# Patient Record
Sex: Female | Born: 1995 | Race: White | Hispanic: No | Marital: Single | Smoking: Never smoker
Health system: Southern US, Community
[De-identification: ages and names within clinical notes are randomized; demographics above are authoritative.]

## PROBLEM LIST (undated history)

## (undated) NOTE — Progress Notes (Signed)
 Formatting of this note is different from the original.  [facility] Treatment Note   Loretta Bennett is here for an infusion of  Administrations This Visit     0.9%  NaCl infusion     Admin Date 11/06/2022 Action New Bag Dose  Rate 30 mL/hr Route Intravenous Documented By Abby Greig BRAVO, RN       diphenhydrAMINE  (BENADRYL ) capsule     Admin Date 11/06/2022 Action Given Dose 25 mg Route Oral Documented By Abby Greig BRAVO, RN       iron dextran complex (INFED) 25 mg in sodium chloride  0.9 % 25 mL IVPB     Admin Date 11/06/2022 Action New Bag Dose 25 mg Rate 300 mL/hr Route Intravenous Documented By Abby Greig BRAVO, RN       iron dextran complex (INFED) 975 mg in sodium chloride  0.9 % 250 mL IVPB     Admin Date 11/06/2022 Action New Bag Dose 975 mg Rate 250 mL/hr Route Intravenous Documented By Salvo, Kayla L, RN        PLAN OF CARE  GOAL:  Loretta Bennett will receive ordered infusion without signs of hypersensitivity.    Intervention:   Education provided to patient.  Questions or concerns addressed.  Patient was monitored throughout the infusion per physician orders.  Vital signs monitored per unit standards.  Vitals:   11/06/22 1635 11/06/22 1654 11/06/22 1842  BP: 121/74 102/62 107/66  Pulse: 78 68 60  Resp: 16 20 20   Temp: 36.3 C (97.3 F) 36.4 C (97.5 F) 36.3 C (97.3 F)  TempSrc: Temporal Temporal Temporal  SpO2: 99% 100% 100%   DISCHARGE:   Loretta Bennett tolerated infusion/injection well. Vital signs stable.  Discharge criteria met. Discharge instructions reviewed. No further questions at this time.  Patient discharged in stable condition. Belongings sent with patient.  See MAR, Flowsheets, and Infusion Navigator for further documentation and/or assessments.   Future Visits: No future appointments.    Electronically signed by Abby Greig BRAVO, RN at 11/06/2022  6:45 PM CDT

---
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
## 2017-04-06 IMAGING — US US ABDOMEN LIMITED
1 series · 14 of 25 positions shown · non-contrast
Comparison: None.

CLINICAL DATA: Acute onset of right upper quadrant abdominal pain.
Initial encounter.

EXAM:
US ABDOMEN LIMITED - RIGHT UPPER QUADRANT

[Series 1: us abdomen limited · 0.20mm/px · 14 of 53 slices shown]
[im 1/53]
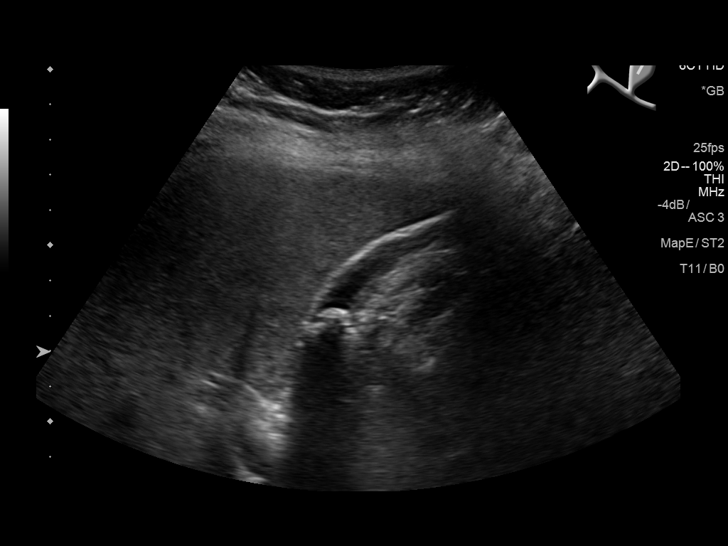
[im 5/53]
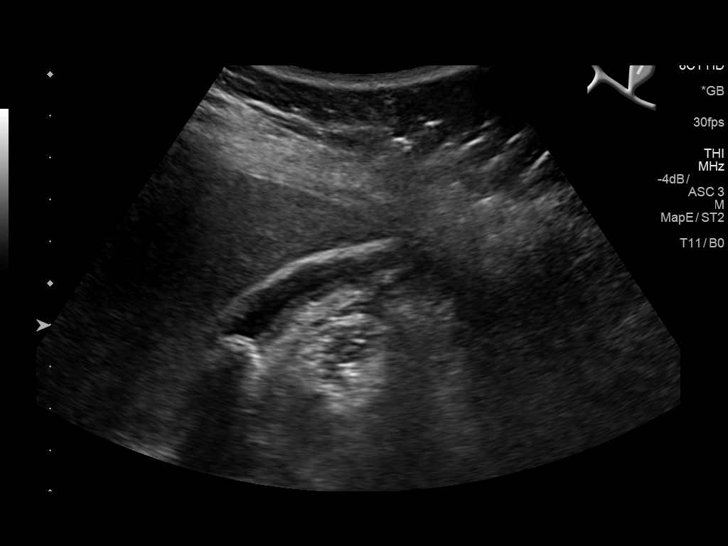
[im 9/53]
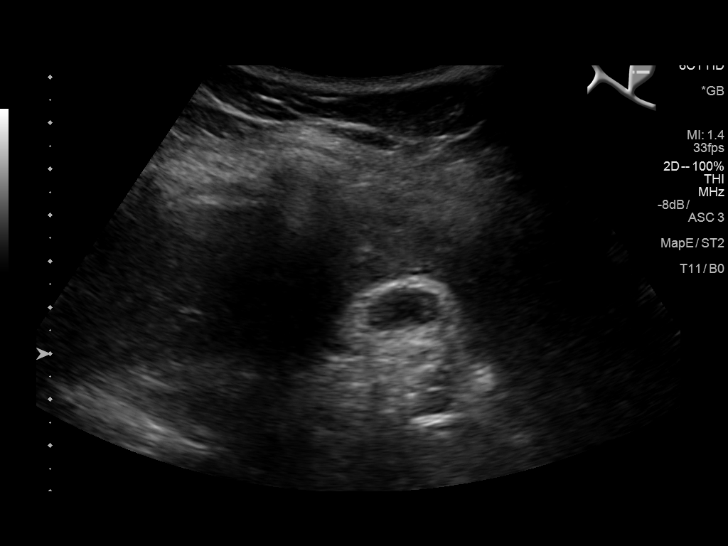
[im 14/53]
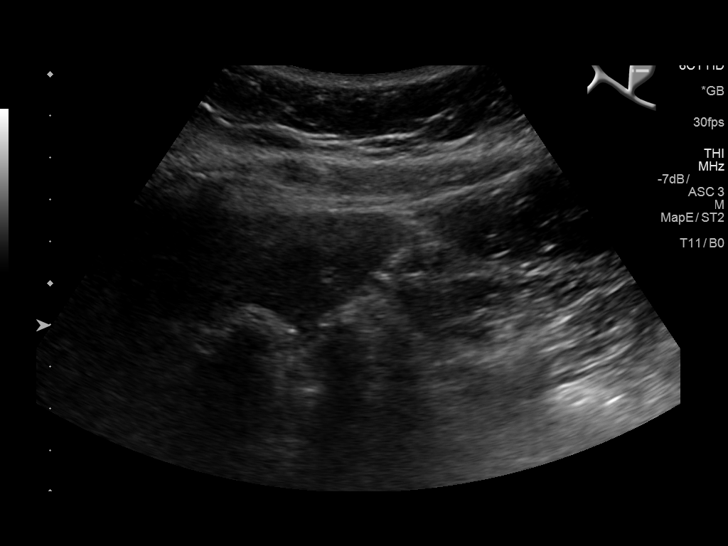
[im 18/53]
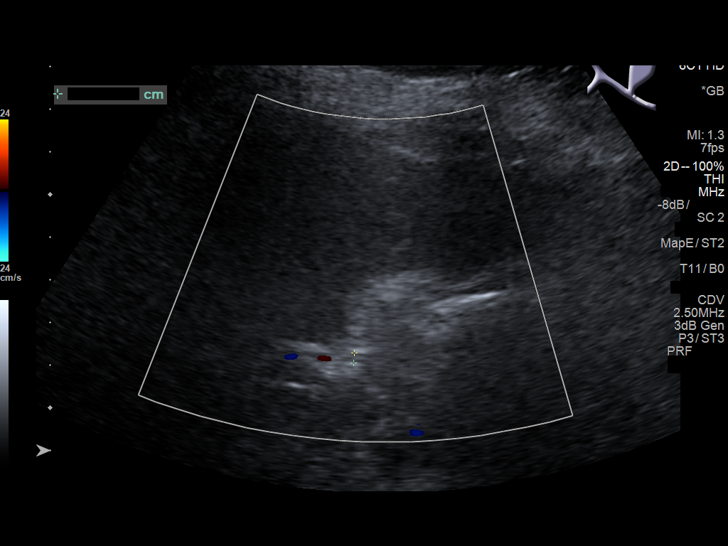
[im 20/53]
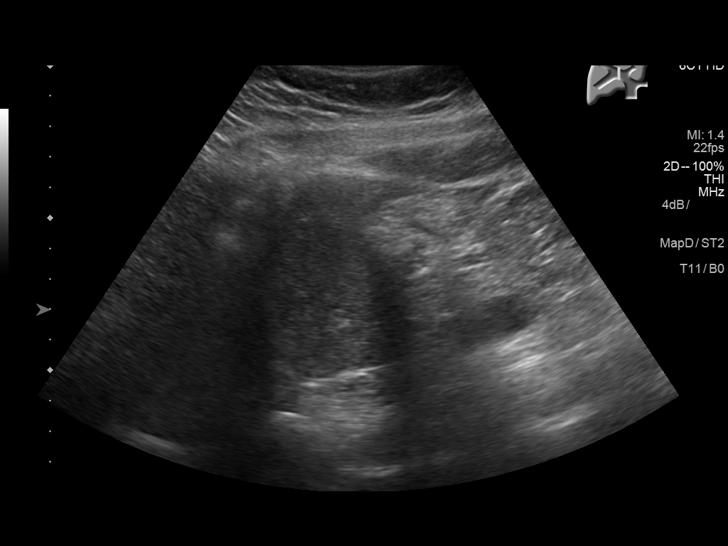
[im 24/53]
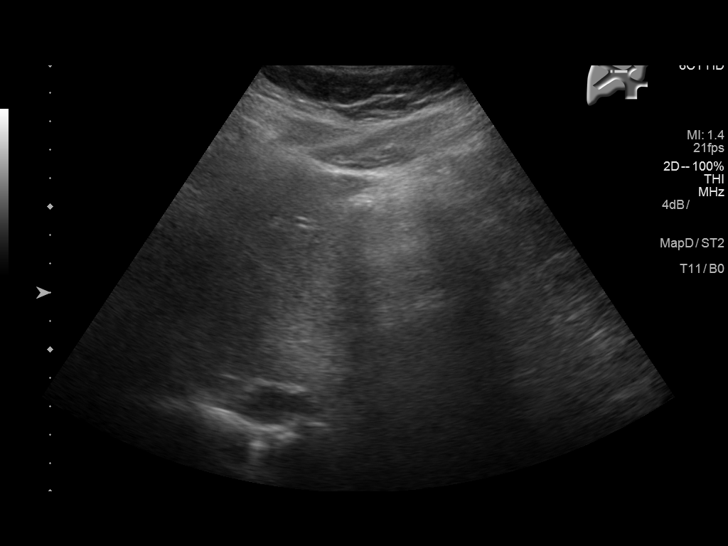
[im 29/53]
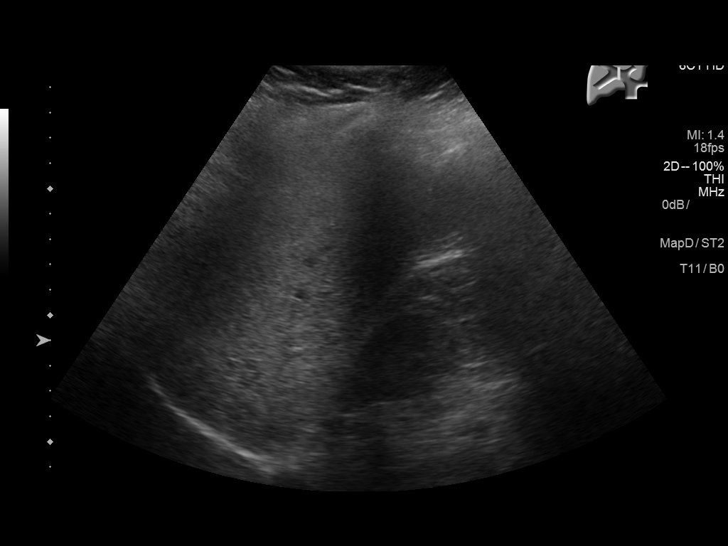
[im 33/53]
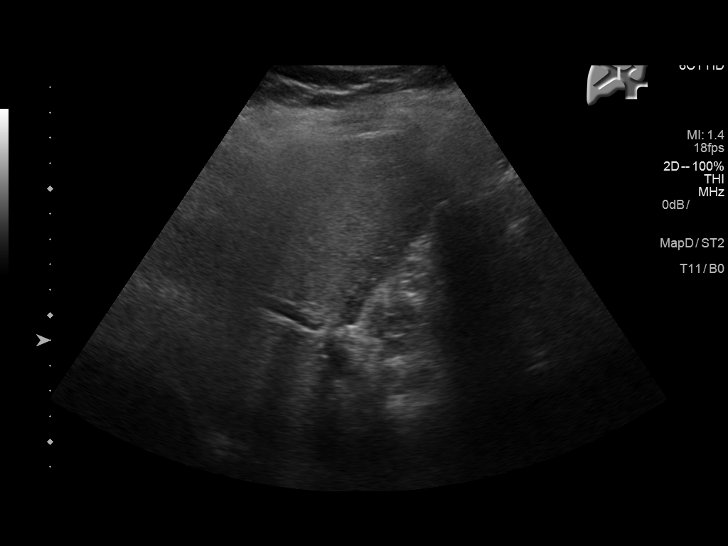
[im 35/53]
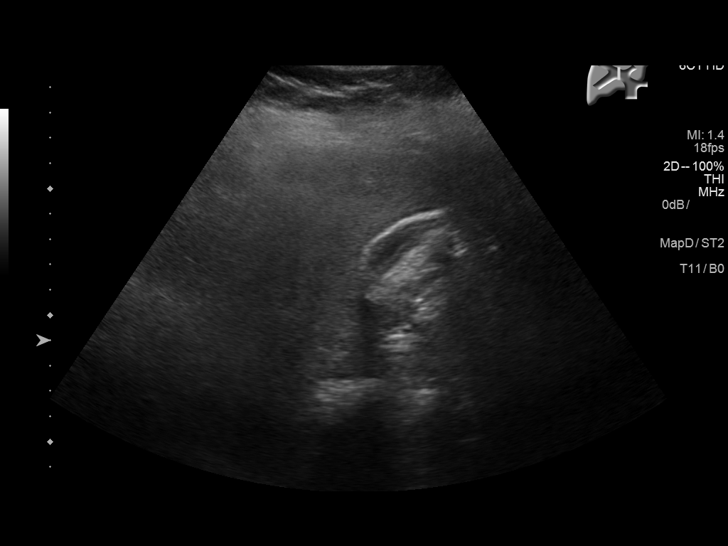
[im 40/53]
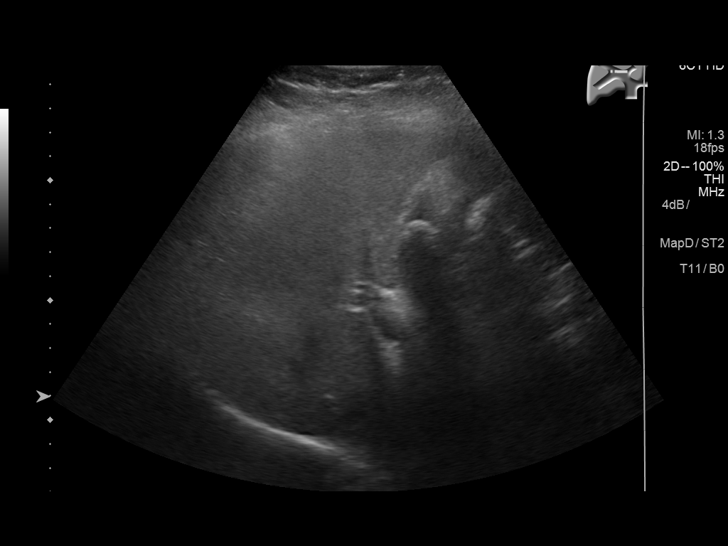
[im 44/53]
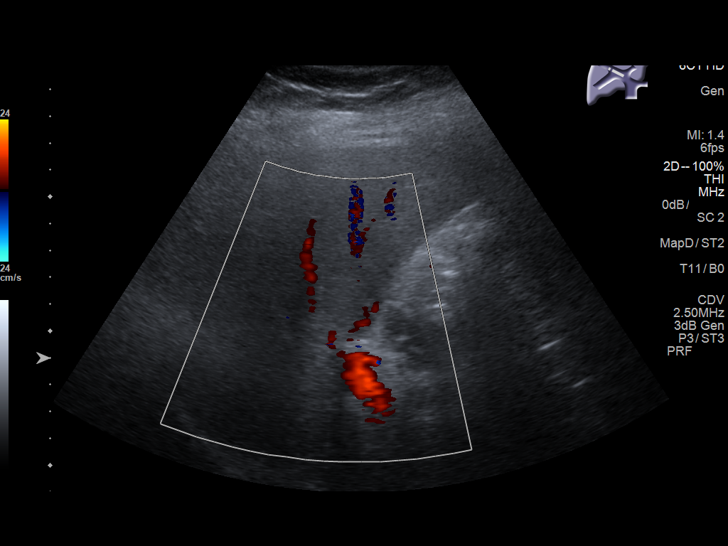
[im 48/53]
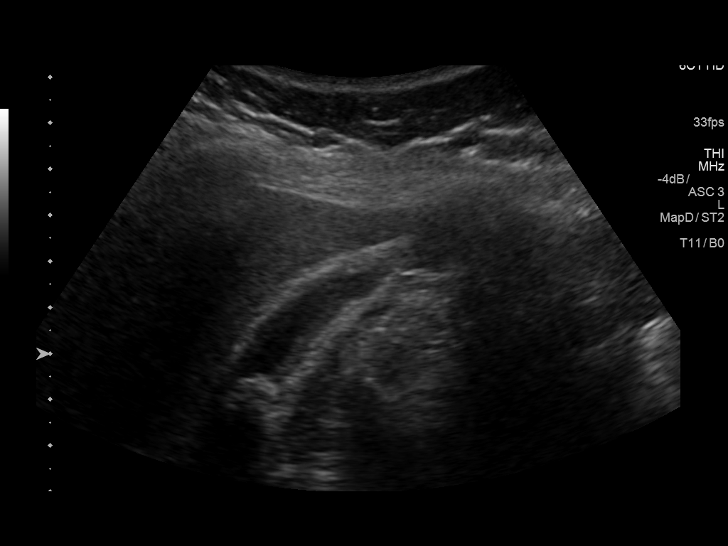
[im 53/53]
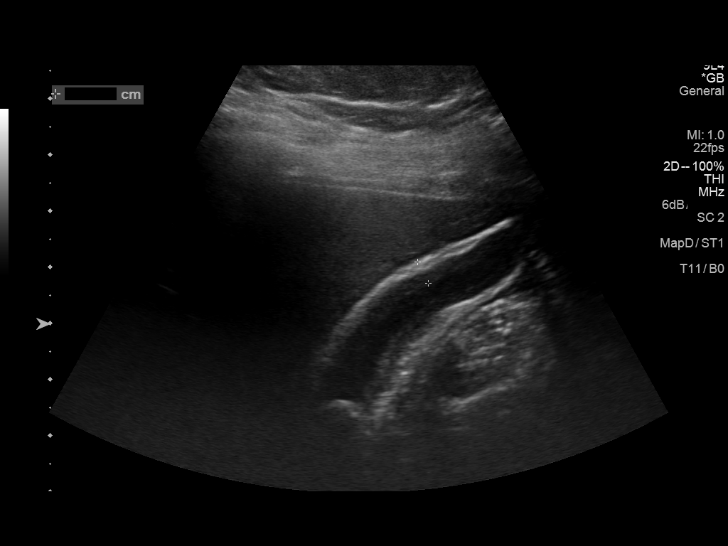

[14 of 25 positions shown; findings below may reference images not displayed]

FINDINGS: Gallbladder:

A 1.4 cm stone is noted lodged at the base of the gallbladder.
Gallbladder wall thickening is noted, though this is nonspecific
given that the gallbladder is relatively decompressed. No
ultrasonographic Murphy's sign is elicited, though evaluation for
Murphy's sign is somewhat suboptimal as the patient is on pain
medication. No pericholecystic fluid is seen.

Common bile duct:

Diameter: 0.2 cm, within normal limits in caliber.

Liver:

No focal lesion identified. Diffusely increased parenchymal
echogenicity likely reflects fatty infiltration.
IMPRESSION: 1. 1.4 cm stone lodged at the base of the gallbladder. The
gallbladder wall is thickened, though the gallbladder is relatively
decompressed. No definite ultrasonographic Murphy's sign elicited,
though evaluation is suboptimal. This could reflect intermittent
symptoms secondary to the lodged stone; there is no definite
evidence for cholecystitis.
2. Diffuse fatty infiltration within the liver.

## 2017-09-30 IMAGING — CR DG CHOLANGIOGRAM OPERATIVE
1 series · 10 of 10 positions shown · non-contrast
Comparison: None.

CLINICAL DATA: Elective cholecystectomy. Intraoperative
cholangiogram.

EXAM:
INTRAOPERATIVE CHOLANGIOGRAM
TECHNIQUE: Cholangiographic images from the C-arm fluoroscopic device were
submitted for interpretation post-operatively. Please see the
procedural report for the amount of contrast and the fluoroscopy
time utilized.

[Series 6001: ely · 10 of 38 frames shown]
[frame 1/38]
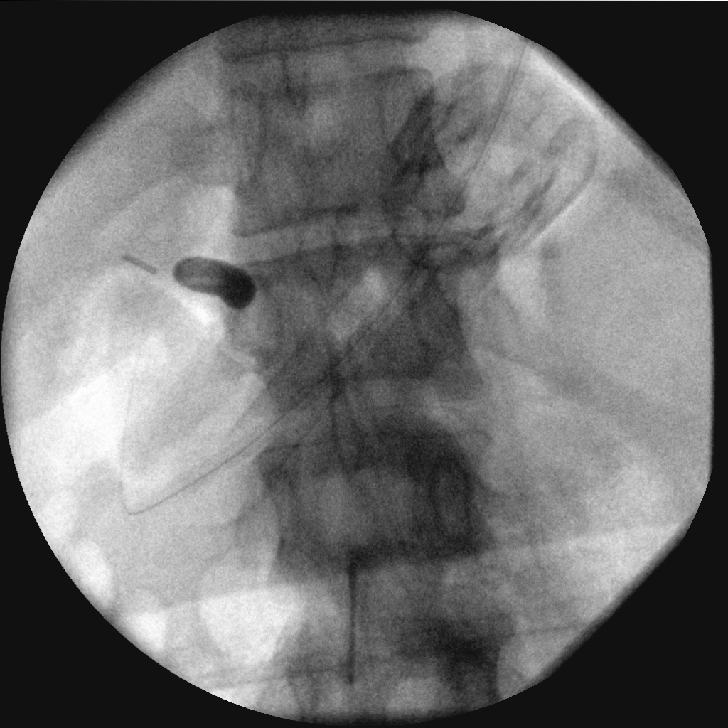
[frame 5/38]
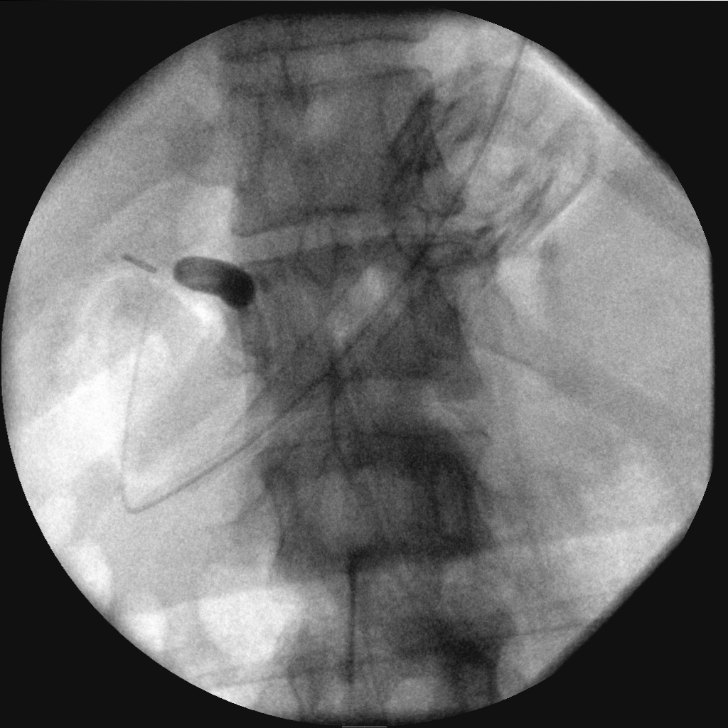
[frame 9/38]
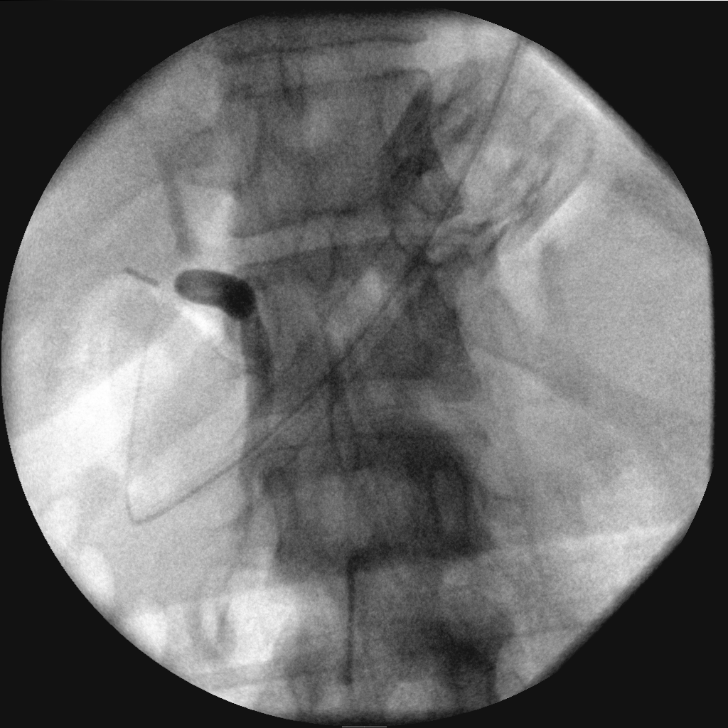
[frame 13/38]
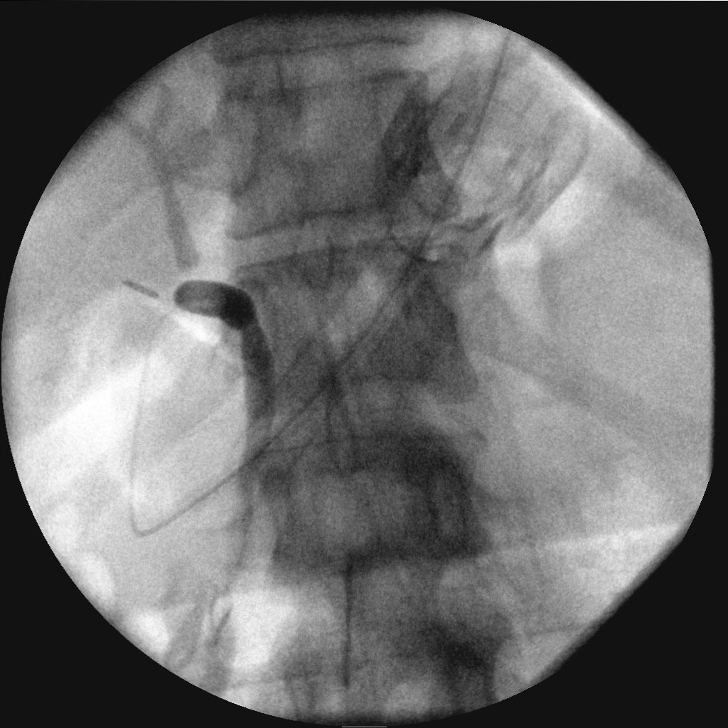
[frame 17/38]
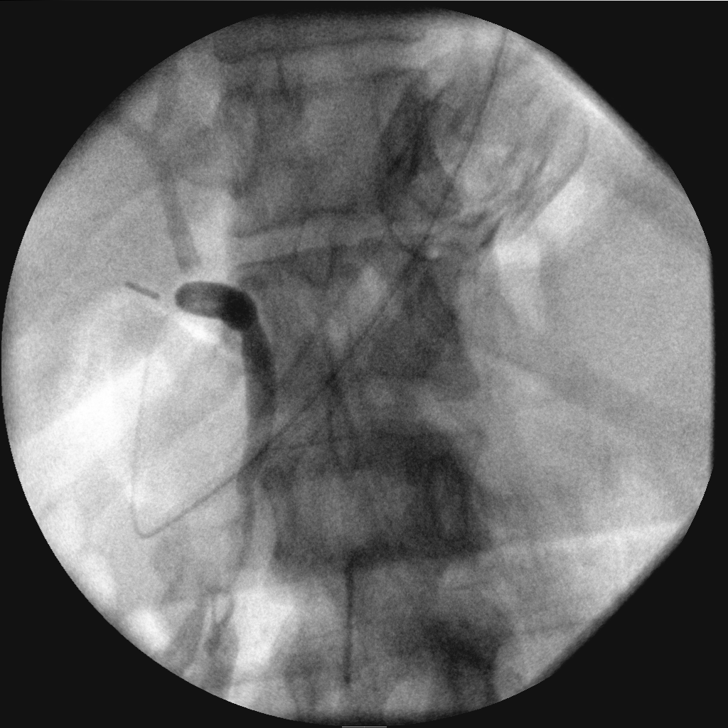
[frame 21/38]
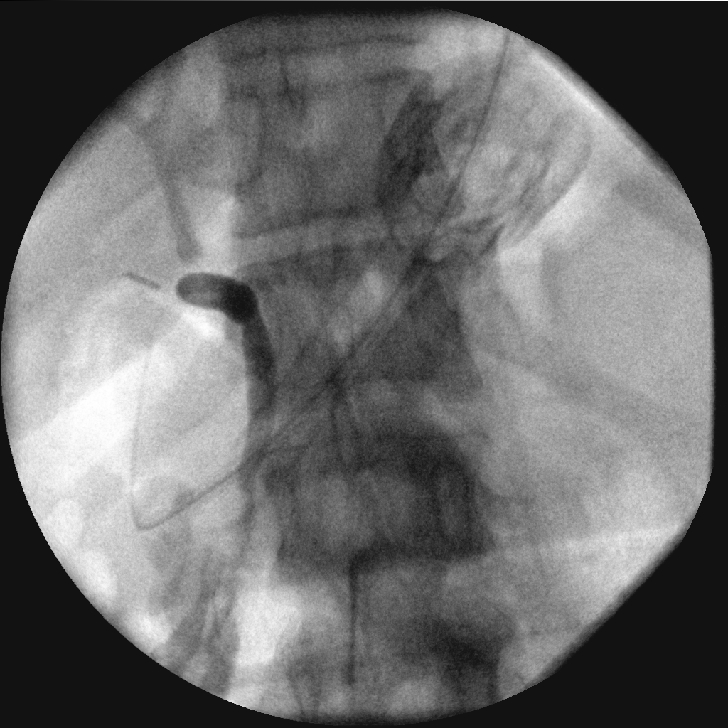
[frame 25/38]
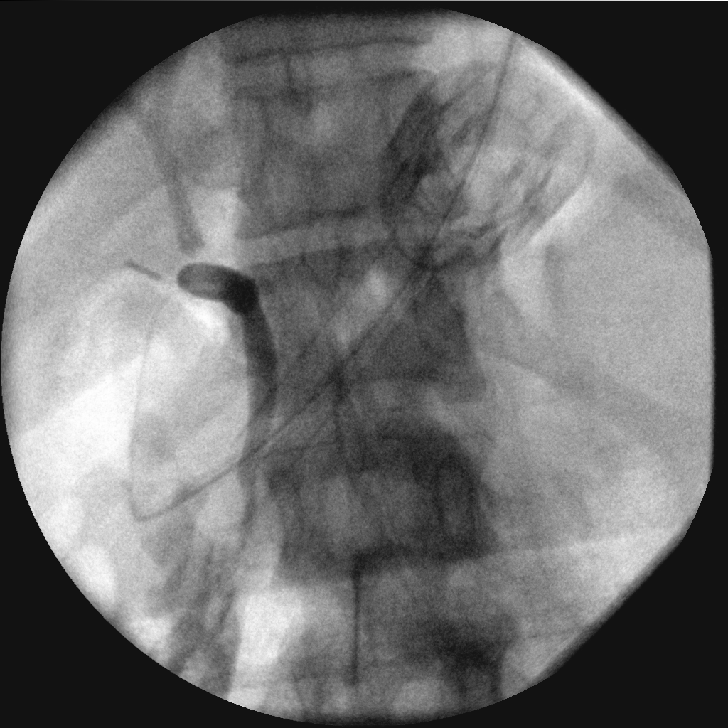
[frame 29/38]
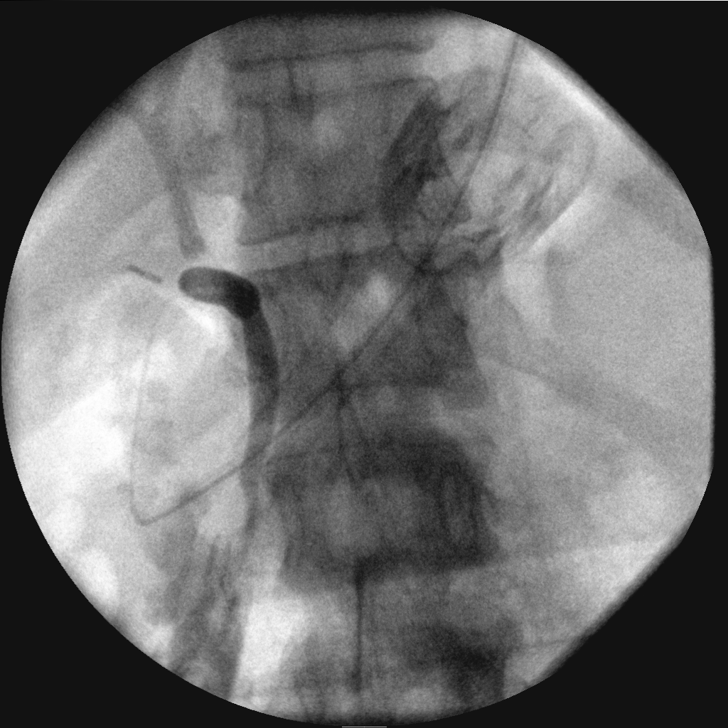
[frame 33/38]
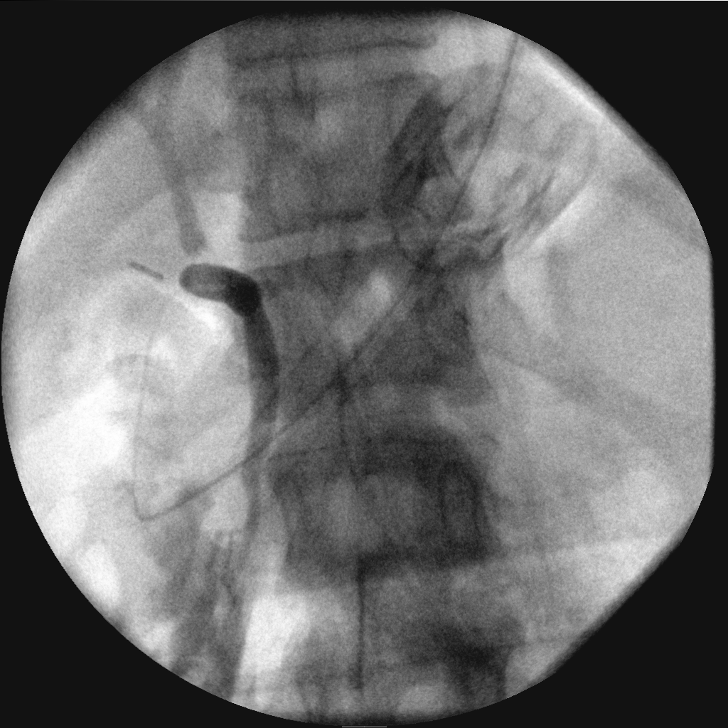
[frame 38/38]
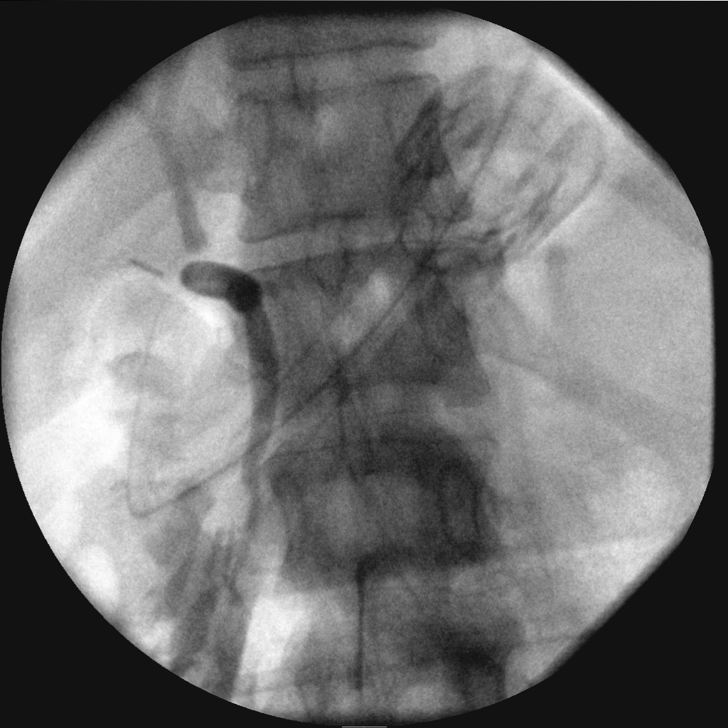

[10 of 10 positions shown; findings below may reference images not displayed]

FINDINGS: Multiple fluoro graphic images of the cystic duct remnant and the
common bile duct were acquired during the injection of contrast into
the cystic duct remnant. There is no duct dilation. No filling
defect is seen to suggest a duct stone.
IMPRESSION: Intraoperative cholangiogram.  No evidence of a duct stone.

## 2018-03-17 LAB — OB RESULTS CONSOLE VARICELLA ZOSTER ANTIBODY, IGG: Varicella: IMMUNE

## 2018-03-17 LAB — OB RESULTS CONSOLE HEPATITIS B SURFACE ANTIGEN: Hepatitis B Surface Ag: NEGATIVE

## 2018-03-17 LAB — OB RESULTS CONSOLE RPR: RPR: NONREACTIVE

## 2018-03-17 LAB — OB RESULTS CONSOLE RUBELLA ANTIBODY, IGM: Rubella: NON-IMMUNE/NOT IMMUNE

## 2018-05-14 NOTE — L&D Delivery Note (Signed)
Delivery Note At 7:50 PM a viable and healthy female was delivered via Vaginal, Spontaneous (Presentation: ROA).  APGAR: 9, 9; weight 5 lb 7.8 oz (2490 g).   Placenta status: spon, intact .  Cord:  with the following complications: 3VC .  Cord pH: 7.28  Anesthesia:  epidural Episiotomy:  none Lacerations:  Vaginal, right labial that did not require repair Suture Repair: 2.0 3.0 vicryl Est. Blood Loss (mL):  450  Mom to postpartum.  Baby to Couplet care / Skin to Skin. Moments of decreased breathing and bradycardia auscultated by SCN RN after 5 min that resolved with stimulation, and baby skin to skin at the close of the procedure.  Bleeding from vaginal lac distal to the hymen necessitated leaving vaginal packing in for 15 min after delivery, and a large clot was removed from firm fundus. cytotec given to assist due to continued trickling after delivery.  Christeen Douglas 07/26/2018, 8:27 PM

## 2018-06-06 LAB — OB RESULTS CONSOLE GC/CHLAMYDIA
Chlamydia: NEGATIVE
Gonorrhea: NEGATIVE

## 2018-07-25 ENCOUNTER — Other Ambulatory Visit: Payer: Self-pay

## 2018-07-25 ENCOUNTER — Inpatient Hospital Stay
Admission: EM | Admit: 2018-07-25 | Discharge: 2018-07-31 | DRG: 806 | Disposition: A | Discharge status: Home / Self Care | Payer: Medicaid Other | Attending: Certified Nurse Midwife | Admitting: Certified Nurse Midwife

## 2018-07-25 DIAGNOSIS — Z3A36 36 weeks gestation of pregnancy: Secondary | ICD-10-CM | POA: Diagnosis not present

## 2018-07-25 DIAGNOSIS — J45909 Unspecified asthma, uncomplicated: Secondary | ICD-10-CM | POA: Diagnosis present

## 2018-07-25 DIAGNOSIS — O1414 Severe pre-eclampsia complicating childbirth: Secondary | ICD-10-CM | POA: Diagnosis present

## 2018-07-25 DIAGNOSIS — Z23 Encounter for immunization: Secondary | ICD-10-CM

## 2018-07-25 DIAGNOSIS — O9081 Anemia of the puerperium: Secondary | ICD-10-CM | POA: Diagnosis not present

## 2018-07-25 DIAGNOSIS — D62 Acute posthemorrhagic anemia: Secondary | ICD-10-CM | POA: Diagnosis not present

## 2018-07-25 DIAGNOSIS — O1410 Severe pre-eclampsia, unspecified trimester: Secondary | ICD-10-CM | POA: Diagnosis present

## 2018-07-25 DIAGNOSIS — O9952 Diseases of the respiratory system complicating childbirth: Secondary | ICD-10-CM | POA: Diagnosis present

## 2018-07-25 DIAGNOSIS — O163 Unspecified maternal hypertension, third trimester: Secondary | ICD-10-CM | POA: Diagnosis present

## 2018-07-25 DIAGNOSIS — R633 Feeding difficulties: Secondary | ICD-10-CM

## 2018-07-25 DIAGNOSIS — R030 Elevated blood-pressure reading, without diagnosis of hypertension: Secondary | ICD-10-CM | POA: Diagnosis present

## 2018-07-25 DIAGNOSIS — R6339 Other feeding difficulties: Secondary | ICD-10-CM

## 2018-07-25 DIAGNOSIS — D649 Anemia, unspecified: Secondary | ICD-10-CM | POA: Diagnosis not present

## 2018-07-25 LAB — GROUP B STREP BY PCR: Group B strep by PCR: NEGATIVE

## 2018-07-25 LAB — COMPREHENSIVE METABOLIC PANEL
ALT: 34 U/L (ref 0–44)
AST: 51 U/L — ABNORMAL HIGH (ref 15–41)
Albumin: 2.6 g/dL — ABNORMAL LOW (ref 3.5–5.0)
Alkaline Phosphatase: 134 U/L — ABNORMAL HIGH (ref 38–126)
Anion gap: 10 (ref 5–15)
BUN: 9 mg/dL (ref 6–20)
CALCIUM: 8.8 mg/dL — AB (ref 8.9–10.3)
CO2: 20 mmol/L — ABNORMAL LOW (ref 22–32)
CREATININE: 0.53 mg/dL (ref 0.44–1.00)
Chloride: 110 mmol/L (ref 98–111)
GFR calc Af Amer: >60 mL/min (ref >60)
GFR calc non Af Amer: >60 mL/min (ref >60)
Glucose, Bld: 76 mg/dL (ref 70–99)
Potassium: 3.8 mmol/L (ref 3.5–5.1)
Sodium: 140 mmol/L (ref 135–145)
Total Bilirubin: 0.6 mg/dL (ref 0.3–1.2)
Total Protein: 6.1 g/dL — ABNORMAL LOW (ref 6.5–8.1)

## 2018-07-25 LAB — CBC
HCT: 31.2 % — ABNORMAL LOW (ref 36.0–46.0)
Hemoglobin: 9.7 g/dL — ABNORMAL LOW (ref 12.0–15.0)
MCH: 23.9 pg — ABNORMAL LOW (ref 26.0–34.0)
MCHC: 31.1 g/dL (ref 30.0–36.0)
MCV: 76.8 fL — ABNORMAL LOW (ref 80.0–100.0)
Platelets: 151 10*3/uL (ref 150–400)
RBC: 4.06 MIL/uL (ref 3.87–5.11)
RDW: 15.3 % (ref 11.5–15.5)
WBC: 11 10*3/uL — ABNORMAL HIGH (ref 4.0–10.5)
nRBC: 0 % (ref 0.0–0.2)

## 2018-07-25 LAB — PROTEIN / CREATININE RATIO, URINE
Creatinine, Urine: 259 mg/dL
Protein Creatinine Ratio: 1.26 mg/mg{Cre} — ABNORMAL HIGH (ref 0.00–0.15)
Total Protein, Urine: 326 mg/dL

## 2018-07-25 LAB — CHLAMYDIA/NGC RT PCR (ARMC ONLY)
Chlamydia Tr: NOT DETECTED
N gonorrhoeae: NOT DETECTED

## 2018-07-25 LAB — TYPE AND SCREEN
ABO/RH(D): A POS
Antibody Screen: NEGATIVE

## 2018-07-25 LAB — RAPID HIV SCREEN (HIV 1/2 AB+AG)
HIV 1/2 Antibodies: NONREACTIVE
HIV-1 P24 Antigen - HIV24: NONREACTIVE

## 2018-07-25 MED ORDER — AMMONIA AROMATIC IN INHA
RESPIRATORY_TRACT | Status: AC
  Filled 2018-07-25: qty 10

## 2018-07-25 MED ORDER — OXYTOCIN 40 UNITS IN NORMAL SALINE INFUSION - SIMPLE MED
2.5000 [IU]/h | INTRAVENOUS | Status: DC
  Administered 2018-07-26 – 2018-07-27 (×2): 2.5 [IU]/h via INTRAVENOUS
  Filled 2018-07-25 (×2): qty 1000

## 2018-07-25 MED ORDER — LACTATED RINGERS IV SOLN: 500.0000 mL | INTRAVENOUS | Status: DC | PRN

## 2018-07-25 MED ORDER — CYCLOBENZAPRINE HCL 10 MG PO TABS
10.0000 mg | ORAL_TABLET | Freq: Once | ORAL | Status: AC | PRN
  Administered 2018-07-25: 10 mg via ORAL
  Filled 2018-07-25: qty 1

## 2018-07-25 MED ORDER — NIFEDIPINE 10 MG PO CAPS
10.0000 mg | ORAL_CAPSULE | ORAL | Status: DC | PRN
  Administered 2018-07-25: 10 mg via ORAL
  Filled 2018-07-25: qty 1

## 2018-07-25 MED ORDER — MAGNESIUM SULFATE 40 G IN LACTATED RINGERS - SIMPLE
2.0000 g/h | INTRAVENOUS | Status: DC
  Administered 2018-07-25 – 2018-07-27 (×3): 2 g/h via INTRAVENOUS
  Filled 2018-07-25 (×3): qty 500

## 2018-07-25 MED ORDER — HYDRALAZINE HCL 20 MG/ML IJ SOLN: 10.0000 mg | INTRAMUSCULAR | Status: DC | PRN

## 2018-07-25 MED ORDER — LIDOCAINE HCL (PF) 1 % IJ SOLN
INTRAMUSCULAR | Status: AC
  Filled 2018-07-25: qty 30

## 2018-07-25 MED ORDER — CALCIUM CARBONATE ANTACID 500 MG PO CHEW: 2.0000 | CHEWABLE_TABLET | ORAL | Status: DC | PRN

## 2018-07-25 MED ORDER — ACETAMINOPHEN 325 MG PO TABS: 650.0000 mg | ORAL_TABLET | ORAL | Status: DC | PRN

## 2018-07-25 MED ORDER — LABETALOL HCL 5 MG/ML IV SOLN
80.0000 mg | INTRAVENOUS | Status: DC | PRN
  Filled 2018-07-25: qty 16

## 2018-07-25 MED ORDER — LABETALOL HCL 5 MG/ML IV SOLN
40.0000 mg | INTRAVENOUS | Status: DC | PRN
  Filled 2018-07-25: qty 8

## 2018-07-25 MED ORDER — CALCIUM GLUCONATE 10 % IV SOLN
INTRAVENOUS | Status: AC
  Filled 2018-07-25: qty 10

## 2018-07-25 MED ORDER — TERBUTALINE SULFATE 1 MG/ML IJ SOLN: 0.2500 mg | Freq: Once | INTRAMUSCULAR | Status: DC | PRN

## 2018-07-25 MED ORDER — BUTORPHANOL TARTRATE 2 MG/ML IJ SOLN
1.0000 mg | INTRAMUSCULAR | Status: DC | PRN
  Administered 2018-07-26 (×2): 1 mg via INTRAVENOUS
  Filled 2018-07-25 (×2): qty 1

## 2018-07-25 MED ORDER — DOCUSATE SODIUM 100 MG PO CAPS: 100.0000 mg | ORAL_CAPSULE | Freq: Every day | ORAL | Status: DC

## 2018-07-25 MED ORDER — NIFEDIPINE 10 MG PO CAPS: 20.0000 mg | ORAL_CAPSULE | ORAL | Status: DC | PRN

## 2018-07-25 MED ORDER — OXYTOCIN BOLUS FROM INFUSION
500.0000 mL | Freq: Once | INTRAVENOUS | Status: AC
  Administered 2018-07-26: 500 mL/h via INTRAVENOUS

## 2018-07-25 MED ORDER — OXYTOCIN 10 UNIT/ML IJ SOLN
INTRAMUSCULAR | Status: AC
  Filled 2018-07-25: qty 2

## 2018-07-25 MED ORDER — MISOPROSTOL 25 MCG QUARTER TABLET
25.0000 ug | ORAL_TABLET | ORAL | Status: DC | PRN
  Administered 2018-07-25: 25 ug via BUCCAL
  Filled 2018-07-25: qty 1

## 2018-07-25 MED ORDER — PRENATAL MULTIVITAMIN CH: 1.0000 | ORAL_TABLET | Freq: Every day | ORAL | Status: DC

## 2018-07-25 MED ORDER — MISOPROSTOL 25 MCG QUARTER TABLET
25.0000 ug | ORAL_TABLET | ORAL | Status: DC | PRN
  Administered 2018-07-25 (×3): 25 ug via VAGINAL
  Administered 2018-07-26: 800 ug via VAGINAL
  Administered 2018-07-26 (×2): 25 ug via VAGINAL
  Filled 2018-07-25 (×5): qty 1

## 2018-07-25 MED ORDER — LABETALOL HCL 5 MG/ML IV SOLN: 40.0000 mg | INTRAVENOUS | Status: DC | PRN

## 2018-07-25 MED ORDER — LACTATED RINGERS IV SOLN
INTRAVENOUS | Status: DC
  Administered 2018-07-25 (×2): via INTRAVENOUS
  Administered 2018-07-26: 100 mL via INTRAVENOUS

## 2018-07-25 MED ORDER — LABETALOL HCL 5 MG/ML IV SOLN
20.0000 mg | INTRAVENOUS | Status: DC | PRN
  Administered 2018-07-25 – 2018-07-26 (×2): 20 mg via INTRAVENOUS
  Filled 2018-07-25 (×3): qty 4

## 2018-07-25 MED ORDER — LIDOCAINE HCL (PF) 1 % IJ SOLN: 30.0000 mL | INTRAMUSCULAR | Status: DC | PRN

## 2018-07-25 MED ORDER — SOD CITRATE-CITRIC ACID 500-334 MG/5ML PO SOLN: 30.0000 mL | ORAL | Status: DC | PRN

## 2018-07-25 MED ORDER — ONDANSETRON HCL 4 MG/2ML IJ SOLN
4.0000 mg | Freq: Four times a day (QID) | INTRAMUSCULAR | Status: DC | PRN
  Administered 2018-07-25: 4 mg via INTRAVENOUS
  Filled 2018-07-25: qty 2

## 2018-07-25 MED ORDER — ACETAMINOPHEN 325 MG PO TABS
650.0000 mg | ORAL_TABLET | ORAL | Status: DC | PRN
  Administered 2018-07-25: 650 mg via ORAL
  Filled 2018-07-25: qty 2

## 2018-07-25 MED ORDER — MAGNESIUM SULFATE BOLUS VIA INFUSION
4.0000 g | Freq: Once | INTRAVENOUS | Status: AC
  Administered 2018-07-25: 4 g via INTRAVENOUS
  Filled 2018-07-25: qty 500

## 2018-07-25 MED ORDER — MISOPROSTOL 200 MCG PO TABS
ORAL_TABLET | ORAL | Status: AC
  Administered 2018-07-26: 25 ug via VAGINAL
  Filled 2018-07-25: qty 4

## 2018-07-25 NOTE — OB Triage Note (Signed)
Pt G1P0 presents at [redacted]w[redacted]d from the office for high BP. Pt reports +FM. No CTX/LOF/bleeding. Monitors applied. BP Q15. Will continue to monitor.

## 2018-07-25 NOTE — H&P (Signed)
OB History & Physical   History of Present Illness:  Chief Complaint: elevated blood pressure  HPI:  Loretta Bennett is a 23 y.o. G1P0 female at [redacted]w[redacted]d dated by LMP c/w [redacted]w[redacted]d ultrasound. She presents to L&D after having severe range blood pressures in the office, with readings of 178/126 and 170/118 10 minutes later. A friend checked her BP two days ago at home and it was 150/90.   She reports:  -active fetal movement -no leakage of fluid -no vaginal bleeding -no contractions -no headache or vision changes -some nausea, but no vomiting -no acute epigastric or RUQ pain -no chest pain or shortness of breath  Pregnancy Issues: 1. 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 History of iron deficiency anemia, on PO iron, had been scheduled for IV iron infusion on 07/30/2018 3. Asthma, rare inhaler use 4. Anxiety/depression, no meds 5. Entered care at 18 weeks 6. Positive for trich during pregnancy, retest after treatment negative 7. Rubella non-immune, advise vaccination postpartum 8. Preeclampsia with severe features upon admission   Maternal Medical History:  History reviewed. No pertinent past medical history.  Past Surgical History:  Procedure Laterality Date  . CHOLECYSTECTOMY N/A 12/31/2015   Procedure: LAPAROSCOPIC CHOLECYSTECTOMY;  Surgeon: Tiney Rouge III, MD;  Location: [facility] ORS;  Service: General;  Laterality: N/A;  . INTRAOPERATIVE CHOLANGIOGRAM  12/31/2015   Procedure: INTRAOPERATIVE CHOLANGIOGRAM;  Surgeon: Tiney Rouge III, MD;  Location: [facility] ORS;  Service: General;;    No Known Allergies  Prior to Admission medications   Medication Sig Start Date End Date Taking? Authorizing Provider  ferrous sulfate 325 (65 FE) MG tablet Take 325 mg by mouth daily with breakfast.   Yes [provider]  prenatal vitamin w/FE, FA (PRENATAL 1 + 1) 27-1 MG TABS tablet Take 1 tablet by mouth daily at 12 noon.   Yes [provider]     [facility] care [facility]: [facility]    Social History: She  reports that she has never smoked. She has never used smokeless tobacco. She reports that she does not drink alcohol or use drugs.  Family History: family history includes Diabetes in her mother; Heart disease in her mother.   Review of Systems: A full review of systems was performed and negative except as noted in the HPI.    Physical Exam:  Vital Signs: BP (!) 151/95   Pulse 90   Temp 98.1 F (36.7 C) (Oral)   Resp 17   Ht 5\' 7"  (1.702 m)   Wt 104.3 kg   BMI 36.02 kg/m   General:   alert, cooperative, appears stated age and no distress  Skin:  normal and no rash or abnormalities  Neurologic:    Alert & oriented x 3  Lungs:   clear to auscultation bilaterally  Heart:   regular rate and rhythm, S1, S2 normal, no murmur, click, rub or gallop  Abdomen:  soft, non-tender; bowel sounds normal; no masses,  no organomegaly  Pelvis:  External genitalia: normal general appearance  FHT:  130 BPM  Presentations: cephalic  Cervix:     Dilation: 1cm   Effacement: 40%   Station:  -3   Consistency: medium   Position: posterior  Extremities: : non-tender, symmetric, +1-2 b/l LE edema bilaterally.    EFW: 6lb 0oz  Results for orders placed or performed during the hospital encounter of 07/25/18 (from the past 24 hour(s))  Protein / creatinine ratio, urine     Status: Abnormal   Collection  Time: 07/25/18 10:17 AM  Result Value Ref Range   Creatinine, Urine 259 mg/dL   Total Protein, Urine 326 mg/dL   Protein Creatinine Ratio 1.26 (H) 0.00 - 0.15 mg/mg[Cre]  Group B strep by PCR     Status: None   Collection Time: 07/25/18 10:18 AM  Result Value Ref Range   Group B strep by PCR NEGATIVE NEGATIVE    Pertinent Results:  Prenatal Labs: Blood type/Rh A+  Antibody screen neg  Rubella Non-immune  Varicella Immune  RPR NR  HBsAg Neg  HIV NR  GC neg  Chlamydia neg  Genetic screening AFP/Tetra negative  1 hour GTT 150  3 hour GTT 68, 155, 147,  107  GBS pending   FHT: FHR: 130 bpm, variability: moderate,  accelerations:  Present,  decelerations:  Absent  Category/reactivity:  Category I TOCO: none  Assessment:  Loretta A Lightner is a 23 y.o. G1P0 female at [redacted]w[redacted]d with preeclampsia with severe features.   Plan:  1. Admit to Labor & Delivery; consents reviewed and obtained  2. Fetal Well being  - Fetal Tracing: category I - GBS pending - Presentation: cephalic confirmed by Leopold's   3. Routine OB: - Prenatal labs reviewed, as above - Rh positive - CBC & T&S on admit - Clear fluids, IVF  4. Preeclampsia with severe features: - Elevated P/C ratio of 1.26 along with severe range BPs - Order sets for PRN nifedipine for severe range BPs  5. Induction of Labor: - Contractions to be monitored with external toco in place - Pelvis unproven - Plan for induction with misoprostol - Plan for continuous fetal monitoring  - Maternal pain control as desired: IVPM, nitrous, regional anesthesia - Anticipate vaginal delivery  6. Post Partum Planning: - Infant feeding: TBD - Contraception: TBD   Discussed patient and plan with Dr. Feliberto Gottron, who is in agreement with this plan.    Genia Del, CNM 07/25/2018 12:42 PM ----- Genia Del Certified Nurse Midwife [facility], [place], Department of OB/GYN [facility]

## 2018-07-25 NOTE — Progress Notes (Signed)
Labor Progress Note  Loretta Bennett is a 23 y.o. G1P0 at [redacted]w[redacted]d by LMP c/w [redacted]w[redacted]d ultrasound admitted for induction of labor due to preeclampsia with severe features.  Subjective:  Patient feeling some mild cramping. Has been sleeping through most of it.   Objective: BP (!) 156/98   Pulse 78   Temp 98.3 F (36.8 C) (Oral)   Resp 17   Ht 5\' 7"  (1.702 m)   Wt 104.3 kg   SpO2 99%   BMI 36.02 kg/m  Notable VS details: BPs moderate to severe  Fetal Assessment: FHT:  FHR: 135 bpm, variability: moderate,  accelerations:  Present,  decelerations:  Absent Category/reactivity:  Category I UC:   Regular, every 2-4 minutes SVE: 1cm/70%/-3 anterior/medium Membrane status: intact Amniotic color: n/a  Labs: Lab Results  Component Value Date   WBC 11.0 (H) 07/25/2018   HGB 9.7 (L) 07/25/2018   HCT 31.2 (L) 07/25/2018   MCV 76.8 (L) 07/25/2018   PLT 151 07/25/2018    Assessment / Plan: Induction of labor for preeclampsia with severe features  Labor: s/p two doses of misoprostol Preeclampsia:  on magnesium sulfate, no signs or symptoms of toxicity, intake and ouput balanced and labs stable, IV labetalol PRN  Fetal Wellbeing:  Category I Pain Control:  Maternal pain control as desired: IVPM, nitrous, regional anesthesia Anticipated MOD:  NSVD  Genia Del, CNM 07/25/2018, 7:01 PM

## 2018-07-25 NOTE — Progress Notes (Signed)
Labor Progress Note  Loretta Bennett is a 23 y.o. G1P0 at [redacted]w[redacted]d by LMP c/w [redacted]w[redacted]d ultrasound admitted for induction of labor due to preeclampsia with severe features.  Subjective:  Patient feeling a lot of rib and back discomfort. Starting to feel contractions.   Objective: BP (!) 158/93   Pulse 77   Temp 98.1 F (36.7 C) (Oral)   Resp 17   Ht 5\' 7"  (1.702 m)   Wt 104.3 kg   BMI 36.02 kg/m  Notable VS details: BPs mild to moderate to severe  Fetal Assessment: FHT:  FHR: 120 bpm, variability: moderate,  accelerations:  Present,  decelerations:  Present variable Category/reactivity:  Category II UC:   Uterine irritability SVE: deferred at this time Membrane status: intact Amniotic color: n/a  Labs: Lab Results  Component Value Date   WBC 11.0 (H) 07/25/2018   HGB 9.7 (L) 07/25/2018   HCT 31.2 (L) 07/25/2018   MCV 76.8 (L) 07/25/2018   PLT 151 07/25/2018    Assessment / Plan: Induction of labor for preeclampsia with severe features  Labor: s/p one dose of misoprostol at 1445 Preeclampsia:  on magnesium sulfate, no signs or symptoms of toxicity, intake and ouput balanced and labs stable Fetal Wellbeing:  Category II for variable decels, overall very reassuring Pain Control:  Maternal pain control as desired: IVPM, nitrous, regional anesthesia Anticipated MOD:  NSVD  Genia Del, CNM 07/25/2018, 3:55 PM

## 2018-07-26 ENCOUNTER — Inpatient Hospital Stay: Payer: Medicaid Other | Admitting: Anesthesiology

## 2018-07-26 LAB — RUPTURE OF MEMBRANE (ROM)PLUS: Rom Plus: NEGATIVE

## 2018-07-26 LAB — RPR: RPR Ser Ql: NONREACTIVE

## 2018-07-26 MED ORDER — ONDANSETRON HCL 4 MG/2ML IJ SOLN: 4.0000 mg | INTRAMUSCULAR | Status: DC | PRN

## 2018-07-26 MED ORDER — ONDANSETRON HCL 4 MG PO TABS: 4.0000 mg | ORAL_TABLET | ORAL | Status: DC | PRN

## 2018-07-26 MED ORDER — ZOLPIDEM TARTRATE 5 MG PO TABS: 5.0000 mg | ORAL_TABLET | Freq: Every evening | ORAL | Status: DC | PRN

## 2018-07-26 MED ORDER — IBUPROFEN 600 MG PO TABS
600.0000 mg | ORAL_TABLET | Freq: Four times a day (QID) | ORAL | Status: DC
  Administered 2018-07-27 – 2018-07-30 (×10): 600 mg via ORAL
  Filled 2018-07-26 (×11): qty 1

## 2018-07-26 MED ORDER — LACTATED RINGERS IV SOLN
500.0000 mL | Freq: Once | INTRAVENOUS | Status: DC
  Administered 2018-07-26: 700 mL via INTRAVENOUS
  Administered 2018-07-27: 1000 mL via INTRAVENOUS

## 2018-07-26 MED ORDER — EPHEDRINE 5 MG/ML INJ: 10.0000 mg | INTRAVENOUS | Status: DC | PRN

## 2018-07-26 MED ORDER — LIDOCAINE HCL (PF) 1 % IJ SOLN
INTRAMUSCULAR | Status: DC | PRN
  Administered 2018-07-26: 1 mL

## 2018-07-26 MED ORDER — TERBUTALINE SULFATE 1 MG/ML IJ SOLN: 0.2500 mg | Freq: Once | INTRAMUSCULAR | Status: DC | PRN

## 2018-07-26 MED ORDER — DIPHENHYDRAMINE HCL 25 MG PO CAPS: 25.0000 mg | ORAL_CAPSULE | Freq: Four times a day (QID) | ORAL | Status: DC | PRN

## 2018-07-26 MED ORDER — BENZOCAINE-MENTHOL 20-0.5 % EX AERO: 1.0000 "application " | INHALATION_SPRAY | CUTANEOUS | Status: DC | PRN

## 2018-07-26 MED ORDER — FENTANYL 2.5 MCG/ML W/ROPIVACAINE 0.15% IN NS 100 ML EPIDURAL (ARMC)
EPIDURAL | Status: DC | PRN
  Administered 2018-07-26: 12 mL/h via EPIDURAL

## 2018-07-26 MED ORDER — FENTANYL 2.5 MCG/ML W/ROPIVACAINE 0.15% IN NS 100 ML EPIDURAL (ARMC)
EPIDURAL | Status: AC
  Filled 2018-07-26: qty 100

## 2018-07-26 MED ORDER — DIPHENHYDRAMINE HCL 50 MG/ML IJ SOLN: 12.5000 mg | INTRAMUSCULAR | Status: DC | PRN

## 2018-07-26 MED ORDER — PRENATAL MULTIVITAMIN CH
1.0000 | ORAL_TABLET | Freq: Every day | ORAL | Status: DC
  Administered 2018-07-27 – 2018-07-31 (×5): 1 via ORAL
  Filled 2018-07-26 (×5): qty 1

## 2018-07-26 MED ORDER — OXYCODONE HCL 5 MG PO TABS: 5.0000 mg | ORAL_TABLET | ORAL | Status: DC | PRN

## 2018-07-26 MED ORDER — PHENYLEPHRINE 40 MCG/ML (10ML) SYRINGE FOR IV PUSH (FOR BLOOD PRESSURE SUPPORT): 80.0000 ug | PREFILLED_SYRINGE | INTRAVENOUS | Status: DC | PRN

## 2018-07-26 MED ORDER — FENTANYL 2.5 MCG/ML W/ROPIVACAINE 0.15% IN NS 100 ML EPIDURAL (ARMC): 12.0000 mL/h | EPIDURAL | Status: DC

## 2018-07-26 MED ORDER — BUPIVACAINE HCL (PF) 0.25 % IJ SOLN
INTRAMUSCULAR | Status: DC | PRN
  Administered 2018-07-26: 1 mL via INTRATHECAL

## 2018-07-26 MED ORDER — OXYTOCIN 40 UNITS IN NORMAL SALINE INFUSION - SIMPLE MED
1.0000 m[IU]/min | INTRAVENOUS | Status: DC
  Administered 2018-07-26: 1 m[IU]/min via INTRAVENOUS

## 2018-07-26 MED ORDER — ACETAMINOPHEN 325 MG PO TABS: 650.0000 mg | ORAL_TABLET | ORAL | Status: DC | PRN

## 2018-07-26 NOTE — Progress Notes (Signed)
Loretta Bennett is a 23 y.o. G1P0 at [redacted]w[redacted]d by LMP consistent with [redacted]w[redacted]d ultrasound admitted for induction of labor due to preeclampsia with severe features.  Subjective: Requesting epidural  Objective: BP 134/79   Pulse 78   Temp 97.6 F (36.4 C) (Oral)   Resp 18   Ht 5\' 7"  (1.702 m)   Wt 104.3 kg   SpO2 96%   BMI 36.02 kg/m  I/O last 3 completed shifts: In: 3412.6 [P.O.:1325; I.V.:2087.6] Out: 2760 [Urine:2760] No intake/output data recorded.  FHT:  FHR: 120 bpm, variability: moderate,  accelerations:  Present,  decelerations:  Absent UC:   regular, every 2-4 minutes SVE:   Dilation: 10 Effacement (%): 100 Station: 0 Exam by:: Dominique,RN  Labs: Lab Results  Component Value Date   WBC 11.0 (H) 07/25/2018   HGB 9.7 (L) 07/25/2018   HCT 31.2 (L) 07/25/2018   MCV 76.8 (L) 07/25/2018   PLT 151 07/25/2018    Assessment / Plan: Induction of labor for preeclampsia with severe features, now fully dilated   Labor: s/p misoprostol, strongcontractions. SROM for clear fluid at 1:30pm  Preeclampsia:  on magnesium sulfate, no signs or symptoms of toxicity, intake and ouput balanced and labs stable, IV labetalol PRN - s/p 1 dose on admission Fetal Wellbeing:  Category I Pain Control:  Will request epidural Anticipated MOD:  NSVD  Christeen Douglas 07/26/2018, 7:27 PM

## 2018-07-26 NOTE — Progress Notes (Signed)
Patient ID: Loretta Bennett, female   DOB: 1995/09/08, 23 y.o.   MRN: [id number]   Strip note: FHT- 115, mod variability, +15x15 accels, no decels Toco: q2-4 min SVE: last check by RN 1.5/70/-3  Continue IOL for PreE with severe features. Continue mag and cervical ripening. Antihypertensives PRN, per protocol. Fetal status reassuring

## 2018-07-26 NOTE — Anesthesia Preprocedure Evaluation (Addendum)
Anesthesia Evaluation  Patient identified by MRN, date of birth, ID band Patient awake    Reviewed: Allergy & Precautions, H&P , NPO status , Patient's Chart, lab work & pertinent test results  Airway Mallampati: III  TM Distance: >3 FB     Dental  (+) Teeth Intact   Pulmonary asthma ,           Cardiovascular Exercise Tolerance: Good hypertension, On Medications   Pre-ecclampsia   Neuro/Psych    GI/Hepatic negative GI ROS,   Endo/Other    Renal/GU   negative genitourinary   Musculoskeletal   Abdominal   Peds  Hematology negative hematology ROS (+)   Anesthesia Other Findings History reviewed. No pertinent past medical history.  Past Surgical History: 12/31/2015: CHOLECYSTECTOMY; N/A     Comment:  Procedure: LAPAROSCOPIC CHOLECYSTECTOMY;  Surgeon: Tiney Rouge III, MD;  Location: [facility] ORS;  Service: General;                Laterality: N/A; 12/31/2015: INTRAOPERATIVE CHOLANGIOGRAM     Comment:  Procedure: INTRAOPERATIVE CHOLANGIOGRAM;  Surgeon: Tiney Rouge III, MD;  Location: [facility] ORS;  Service: General;;  BMI    Body Mass Index:  36.02 kg/m      Reproductive/Obstetrics (+) Pregnancy                            Anesthesia Physical Anesthesia Plan  ASA: III  Anesthesia Plan: Combined Spinal and Epidural   Post-op Pain Management:    Induction:   PONV Risk Score and Plan:   Airway Management Planned:   Additional Equipment:   Intra-op Plan:   Post-operative Plan:   Informed Consent: I have reviewed the patients History and Physical, chart, labs and discussed the procedure including the risks, benefits and alternatives for the proposed anesthesia with the patient or authorized representative who has indicated his/her understanding and acceptance.       Plan Discussed with: Anesthesiologist  Anesthesia Plan Comments:         Anesthesia  Quick Evaluation

## 2018-07-26 NOTE — Progress Notes (Signed)
Loretta Bennett is a 23 y.o. G1P0 at [redacted]w[redacted]d by LMP consistent with [redacted]w[redacted]d ultrasound admitted for induction of labor due to preeclampsia with severe features.  Subjective: Feeling contractions strongly  Objective: BP (!) 152/94   Pulse 70   Temp 97.6 F (36.4 C) (Oral)   Resp 18   Ht 5\' 7"  (1.702 m)   Wt 104.3 kg   SpO2 97%   BMI 36.02 kg/m  I/O last 3 completed shifts: In: 3390.1 [P.O.:1325; I.V.:2065.1] Out: 1695 [Urine:1695] Total I/O In: -  Out: 670 [Urine:670]  FHT:  FHR: 120 bpm, variability: moderate,  accelerations:  Present,  decelerations:  Absent UC:   regular, every 2-4 minutes SVE:   Dilation: 3 Effacement (%): 80 Station: -1 Exam by:: Marriott: Lab Results  Component Value Date   WBC 11.0 (H) 07/25/2018   HGB 9.7 (L) 07/25/2018   HCT 31.2 (L) 07/25/2018   MCV 76.8 (L) 07/25/2018   PLT 151 07/25/2018    Assessment / Plan: Induction of labor for preeclampsia with severe features  SROM for clear fluid as placing foley cath now  Labor: s/p misoprostol, moderate contractions, now 3cm, thin and with fetal head pressure. Start pitocin with slow titration. Preeclampsia:  on magnesium sulfate, no signs or symptoms of toxicity, intake and ouput balanced and labs stable, IV labetalol PRN - s/p 1 dose on admission Fetal Wellbeing:  Category I Pain Control:  Maternal pain control as desired: IVPM, nitrous, regional anesthesia Anticipated MOD:  NSVD  Christeen Douglas 07/26/2018, 1:44 PM

## 2018-07-26 NOTE — Progress Notes (Signed)
Loretta Bennett is a 23 y.o. G1P0 at [redacted]w[redacted]d by LMP consistent with [redacted]w[redacted]d ultrasound admitted for induction of labor due to preeclampsia with severe features.  Subjective: Feeling contractions strongly, nitrous helping. FB out now  Objective: BP (!) 150/92   Pulse 79   Temp 97.6 F (36.4 C) (Oral)   Resp 18   Ht 5\' 7"  (1.702 m)   Wt 104.3 kg   SpO2 99%   BMI 36.02 kg/m  I/O last 3 completed shifts: In: 3390.1 [P.O.:1325; I.V.:2065.1] Out: 1695 [Urine:1695] Total I/O In: 6 [I.V.:6] Out: 965 [Urine:965]  FHT:  FHR: 120 bpm, variability: moderate,  accelerations:  Present,  decelerations:  Absent UC:   regular, every 2-4 minutes SVE:   Dilation: 5 Effacement (%): 80 Station: -1 Exam by:: Marriott: Lab Results  Component Value Date   WBC 11.0 (H) 07/25/2018   HGB 9.7 (L) 07/25/2018   HCT 31.2 (L) 07/25/2018   MCV 76.8 (L) 07/25/2018   PLT 151 07/25/2018    Assessment / Plan: Induction of labor for preeclampsia with severe features, now on pitocin.    Labor: s/p misoprostol, moderate contractions. Pitocin with slow titration, currently on 65mu/min SROM for clear fluid at 1:30pm  Preeclampsia:  on magnesium sulfate, no signs or symptoms of toxicity, intake and ouput balanced and labs stable, IV labetalol PRN - s/p 1 dose on admission Fetal Wellbeing:  Category I Pain Control:  Maternal pain control as desired: IVPM, nitrous, regional anesthesia Anticipated MOD:  NSVD  Christeen Douglas 07/26/2018, 3:41 PM

## 2018-07-26 NOTE — Progress Notes (Signed)
Tracing Note  Loretta Bennett is a 23 y.o. G1P0 at [redacted]w[redacted]d by LMP c/w [redacted]w[redacted]d ultrasound admitted for induction of labor due to preeclampsia with severe features.  Subjective:  Sleeping per nursing.   Objective: BP (!) 142/93   Pulse 84   Temp 98.8 F (37.1 C) (Oral)   Resp 16   Ht 5\' 7"  (1.702 m)   Wt 104.3 kg   SpO2 98%   BMI 36.02 kg/m  Notable VS details: BPs mild range  Fetal Assessment: FHT:  FHR: 130 bpm, variability: moderate,  accelerations:  Present,  decelerations:  Absent Category/reactivity:  Category I UC:   Regular, every 2-4 minutes SVE: 1.5cm/70%/-3 at 2330 per RN Membrane status: intact Amniotic color: n/a  Labs: Lab Results  Component Value Date   WBC 11.0 (H) 07/25/2018   HGB 9.7 (L) 07/25/2018   HCT 31.2 (L) 07/25/2018   MCV 76.8 (L) 07/25/2018   PLT 151 07/25/2018    Assessment / Plan: Induction of labor for preeclampsia with severe features  Labor: s/p three doses of misoprostol, mild contractions Preeclampsia:  on magnesium sulfate, no signs or symptoms of toxicity, intake and ouput balanced and labs stable, IV labetalol PRN  Fetal Wellbeing:  Category I Pain Control:  Maternal pain control as desired: IVPM, nitrous, regional anesthesia Anticipated MOD:  NSVD  Genia Del, CNM 07/26/2018, 2:59 AM

## 2018-07-26 NOTE — Anesthesia Procedure Notes (Signed)
Combined spinal epidural Patient location during procedure: OB Start time: 07/26/2018 7:20 PM End time: 07/26/2018 7:30 PM  Staffing Anesthesiologist: Jovita Gamma, MD Performed: anesthesiologist   Preanesthetic Checklist Completed: patient identified, site marked, surgical consent, pre-op evaluation, timeout performed, IV checked, risks and benefits discussed and monitors and equipment checked  Epidural Patient position: sitting Prep: ChloraPrep Patient monitoring: heart rate, continuous pulse ox and blood pressure Approach: midline Location: L3-L4 Injection technique: LOR saline  Needle:  Needle type: Tuohy  Needle gauge: 18 G Needle length: 9 cm and 9 Needle insertion depth: 7 cm Catheter type: closed end flexible Catheter size: 20 Guage Catheter at skin depth: 11 cm  Assessment Events: blood not aspirated, injection not painful, no injection resistance, negative IV test and no paresthesia  Additional Notes  Combined spinal epidural placed.  No paresthesias.  Clear CSF aspirated.  1 mL 0.25% bupivacaine given intrathecally.  Epidural infusion started. Patient tolerated the insertion well without complications.Reason for block:procedure for pain

## 2018-07-26 NOTE — Discharge Summary (Signed)
Obstetrical Discharge Summary  Patient Name: Loretta Bennett DOB: [birth year] MRN: [id number]  Date of Admission: 07/25/2018 Date of Discharge: 07/31/2018 Primary OB: [facility]  Gestational Age at Delivery: [redacted]w[redacted]d  Antepartum complications:   1. 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 History of iron deficiency anemia, on PO iron, had been scheduled for IV iron infusion on 07/30/2018 3. Asthma, rare inhaler use 4. Anxiety/depression, no meds 5. Entered care at 18 weeks 6. Positive for trich during pregnancy, retest after treatment negative 7. Rubella non-immune, advise vaccination postpartum 8. Preeclampsia with severe features upon admission  Admitting Diagnosis: Preeclampsia with severe features upon admission Secondary Diagnosis: Patient Active Problem List   Diagnosis Date Noted  . Preeclampsia, severe 07/31/2018  . Symptomatic anemia 07/31/2018  . Elevated blood pressure affecting pregnancy in third trimester, antepartum 07/25/2018  . Cholecystitis with cholelithiasis 12/30/2015  . Cholecystitis 12/29/2015    Augmentation: Pitocin, Cytotec and Foley Balloon Complications: None Intrapartum complications/course:  Induction of labor NSVD Magnesium x24hrs after delivery  symptomatic anemia - required 1 unit Blood transfusion PP day#4  Date of Delivery: 07/26/2018 Delivered By: BBenjaman KindlerDelivery Type: spontaneous vaginal delivery Anesthesia: epidural Placenta: Spontaneous Laceration: vaginal, with repair and continued oozing, requiring vaginal  packing Episiotomy: none Newborn Data: Live born female "MLane Hacker Birth Weight: 5 lb 7.8 oz (2490 g) APGAR: 9, 9  Newborn Delivery   Birth date/time:  07/26/2018 19:50:00 Delivery type:  Vaginal, Spontaneous       Discharge Physical Exam:  BP (!) 140/96 (BP Location: Left Arm)   Pulse 88   Temp 97.7 F (36.5 C) (Oral)   Resp 16   Ht 5' 7"  (1.702 m)   Wt 104.3 kg   SpO2 98%    Breastfeeding Unknown   BMI 36.02 kg/m   General: NAD CV: RRR Pulm: CTABL, nl effort ABD: s/nd/nt, fundus firm and below the umbilicus Lochia: moderate  DVT Evaluation: LE non-ttp, no evidence of DVT on exam.  Hemoglobin  Date Value Ref Range Status  07/31/2018 7.9 (L) 12.0 - 15.0 g/dL Final   HCT  Date Value Ref Range Status  07/31/2018 26.1 (L) 36.0 - 46.0 % Final   Results for orders placed or performed during the hospital encounter of 07/25/18 (from the past 72 hour(s))  Protein / creatinine ratio, urine     Status: Abnormal   Collection Time: 07/29/18  4:20 PM  Result Value Ref Range   Creatinine, Urine 104 mg/dL   Total Protein, Urine 20 mg/dL    Comment: NO NORMAL RANGE ESTABLISHED FOR THIS TEST   Protein Creatinine Ratio 0.19 (H) 0.00 - 0.15 mg/mg[Cre]    Comment: Performed at [facility] [address] [place] [address] CBC     Status: Abnormal   Collection Time: 07/30/18  4:14 AM  Result Value Ref Range   WBC 5.4 4.0 - 10.5 K/uL   RBC 2.49 (L) 3.87 - 5.11 MIL/uL   Hemoglobin 5.9 (L) 12.0 - 15.0 g/dL   HCT 20.2 (L) 36.0 - 46.0 %   MCV 81.1 80.0 - 100.0 fL   MCH 23.7 (L) 26.0 - 34.0 pg   MCHC 29.2 (L) 30.0 - 36.0 g/dL   RDW 18.3 (H) 11.5 - 15.5 %   Platelets 113 (L) 150 - 400 K/uL    Comment: Immature Platelet Fraction may be clinically indicated, consider ordering this additional test [id number]   nRBC 0.6 (H) 0.0 - 0.2 %  Comment: Performed at [facility], [address] [place], [place] [address]  Hemoglobin and hematocrit, blood     Status: Abnormal   Collection Time: 07/30/18  8:24 AM  Result Value Ref Range   Hemoglobin 6.4 (L) 12.0 - 15.0 g/dL   HCT 21.6 (L) 36.0 - 46.0 %    Comment: Performed at [facility], [address] [place], [place] [address]  Prepare RBC     Status: None   Collection Time: 07/30/18  9:30 AM  Result Value Ref Range   Order Confirmation      ORDER PROCESSED BY BLOOD  BANK Performed at [facility], [address] [place], [place] [address]   Type and screen [place]     Status: None   Collection Time: 07/30/18  9:40 AM  Result Value Ref Range   ABO/RH(D) A POS    Antibody Screen NEG    Sample Expiration 08/02/2018    Unit Number [id number]    Blood Component Type RED CELLS,LR    Unit division 00    Status of Unit ISSUED,FINAL    Transfusion Status OK TO TRANSFUSE    Crossmatch Result      Compatible Performed at [facility], [address] [place], [place] [address]   CBC     Status: Abnormal   Collection Time: 07/30/18  4:40 PM  Result Value Ref Range   WBC 8.3 4.0 - 10.5 K/uL   RBC 3.08 (L) 3.87 - 5.11 MIL/uL   Hemoglobin 7.8 (L) 12.0 - 15.0 g/dL   HCT 24.9 (L) 36.0 - 46.0 %   MCV 80.8 80.0 - 100.0 fL   MCH 25.3 (L) 26.0 - 34.0 pg   MCHC 31.3 30.0 - 36.0 g/dL   RDW 17.8 (H) 11.5 - 15.5 %   Platelets 128 (L) 150 - 400 K/uL   nRBC 0.5 (H) 0.0 - 0.2 %    Comment: Performed at [facility], [address] [place], [place] [address]  CBC     Status: Abnormal   Collection Time: 07/31/18  4:54 AM  Result Value Ref Range   WBC 8.2 4.0 - 10.5 K/uL   RBC 3.20 (L) 3.87 - 5.11 MIL/uL   Hemoglobin 7.9 (L) 12.0 - 15.0 g/dL   HCT 26.1 (L) 36.0 - 46.0 %   MCV 81.6 80.0 - 100.0 fL   MCH 24.7 (L) 26.0 - 34.0 pg   MCHC 30.3 30.0 - 36.0 g/dL   RDW 18.6 (H) 11.5 - 15.5 %   Platelets 182 150 - 400 K/uL   nRBC 0.2 0.0 - 0.2 %    Comment: Performed at [facility], [address] [place], [place] [address]   .Post partum course: received Magnesium for 24 hr PP . Blood pressure remained elevated and she required Labetalol 400 mg bid po and Procardia 30 mg xl to control. PP day #4 low HCT requires 1 unit blood transfusion Given MMR for rubella nonimmune Postpartum Procedures:none Disposition: stable, discharge to home. Baby Feeding: breastmilk Baby Disposition: home with mom  Rh  Immune globulin given:n/a Rubella vaccine given: given before d/c  Tdap vaccine given in AP or PP setting: 06/06/2018 Flu vaccine given in AP or PP setting: none  Contraception: micronor  Prenatal Labs: Blood type/Rh --/--/A POS (03/18 0940)  Antibody screen neg  Rubella Non immune   Varicella Immune  RPR NR  HBsAg Neg  HIV NR  GC neg  Chlamydia  neg  Genetic screening negative  1 hour GTT 150  3 hour GTT 68 155 147 107  GBS negative     Plan:  Loretta A Studstill was discharged to home in good condition. Follow-up appointment at [facility]  To see Dr Leafy Ro for BP recheck .   Discharge Medications: Allergies as of 07/31/2018   No Known Allergies     Medication List    STOP taking these medications   ferrous sulfate 325 (65 FE) MG tablet     TAKE these medications   ibuprofen 600 MG tablet Commonly known as:  ADVIL,MOTRIN Take 1 tablet (600 mg total) by mouth every 6 (six) hours.   labetalol 200 MG tablet Commonly known as:  NORMODYNE Take 2 tablets (400 mg total) by mouth 3 (three) times daily.   NIFEdipine 30 MG 24 hr tablet Commonly known as:  ADALAT CC Take 1 tablet (30 mg total) by mouth daily. Start taking on:  August 01, 2018   norethindrone 0.35 MG tablet Commonly known as:  Ortho Micronor Take 1 tablet (0.35 mg total) by mouth daily.   prenatal vitamin w/FE, FA 27-1 MG Tabs tablet Take 1 tablet by mouth daily at 12 noon.       Follow-up Information    Benjaman Kindler, MD Follow up in 2 week(s).   Specialty:  Obstetrics and Gynecology Why:  2 week blood pressure check , 6 week PP care  Contact information: [place] [address] [phone]           Signed: Laverta Baltimore MD

## 2018-07-27 LAB — CBC
HCT: 26.2 % — ABNORMAL LOW (ref 36.0–46.0)
Hemoglobin: 8 g/dL — ABNORMAL LOW (ref 12.0–15.0)
MCH: 23.5 pg — AB (ref 26.0–34.0)
MCHC: 30.5 g/dL (ref 30.0–36.0)
MCV: 77.1 fL — ABNORMAL LOW (ref 80.0–100.0)
Platelets: 112 10*3/uL — ABNORMAL LOW (ref 150–400)
RBC: 3.4 MIL/uL — AB (ref 3.87–5.11)
RDW: 16.8 % — ABNORMAL HIGH (ref 11.5–15.5)
WBC: 14.4 10*3/uL — ABNORMAL HIGH (ref 4.0–10.5)
nRBC: 0 % (ref 0.0–0.2)

## 2018-07-27 LAB — URINALYSIS, ROUTINE W REFLEX MICROSCOPIC
Bacteria, UA: NONE SEEN
Bilirubin Urine: NEGATIVE
Glucose, UA: NEGATIVE mg/dL
Ketones, ur: NEGATIVE mg/dL
Leukocytes,Ua: NEGATIVE
Nitrite: NEGATIVE
Protein, ur: 30 mg/dL — AB
RBC / HPF: >50 RBC/hpf — ABNORMAL HIGH (ref 0–5)
Specific Gravity, Urine: 1.028 (ref 1.005–1.030)
pH: 5 (ref 5.0–8.0)

## 2018-07-27 LAB — PROTEIN / CREATININE RATIO, URINE
Creatinine, Urine: 228 mg/dL
Protein Creatinine Ratio: 0.41 mg/mg{Cre} — ABNORMAL HIGH (ref 0.00–0.15)
Total Protein, Urine: 94 mg/dL

## 2018-07-27 MED ORDER — SENNOSIDES-DOCUSATE SODIUM 8.6-50 MG PO TABS
2.0000 | ORAL_TABLET | ORAL | Status: DC
  Administered 2018-07-28 – 2018-07-31 (×3): 2 via ORAL
  Filled 2018-07-27 (×4): qty 2

## 2018-07-27 MED ORDER — COCONUT OIL OIL
1.0000 "application " | TOPICAL_OIL | Status: DC | PRN
  Administered 2018-07-28: 1 via TOPICAL
  Filled 2018-07-27: qty 120

## 2018-07-27 MED ORDER — MEASLES, MUMPS & RUBELLA VAC IJ SOLR
0.5000 mL | Freq: Once | INTRAMUSCULAR | Status: AC
  Administered 2018-07-31: 0.5 mL via SUBCUTANEOUS
  Filled 2018-07-27 (×2): qty 0.5

## 2018-07-27 MED ORDER — BISACODYL 10 MG RE SUPP: 10.0000 mg | Freq: Every day | RECTAL | Status: DC | PRN

## 2018-07-27 MED ORDER — DIBUCAINE 1 % RE OINT: 1.0000 "application " | TOPICAL_OINTMENT | RECTAL | Status: DC | PRN

## 2018-07-27 MED ORDER — SODIUM CHLORIDE 0.9% FLUSH
3.0000 mL | Freq: Two times a day (BID) | INTRAVENOUS | Status: DC
  Administered 2018-07-28 – 2018-07-31 (×3): 3 mL via INTRAVENOUS

## 2018-07-27 MED ORDER — WITCH HAZEL-GLYCERIN EX PADS: 1.0000 "application " | MEDICATED_PAD | CUTANEOUS | Status: DC | PRN

## 2018-07-27 MED ORDER — PNEUMOCOCCAL VAC POLYVALENT 25 MCG/0.5ML IJ INJ: 0.5000 mL | INJECTION | INTRAMUSCULAR | Status: DC | PRN

## 2018-07-27 MED ORDER — FLEET ENEMA 7-19 GM/118ML RE ENEM: 1.0000 | ENEMA | Freq: Every day | RECTAL | Status: DC | PRN

## 2018-07-27 MED ORDER — SIMETHICONE 80 MG PO CHEW: 80.0000 mg | CHEWABLE_TABLET | ORAL | Status: DC | PRN

## 2018-07-27 MED ORDER — TETANUS-DIPHTH-ACELL PERTUSSIS 5-2.5-18.5 LF-MCG/0.5 IM SUSP
0.5000 mL | Freq: Once | INTRAMUSCULAR | Status: DC
  Filled 2018-07-27: qty 0.5

## 2018-07-27 MED ORDER — LACTATED RINGERS IV SOLN: INTRAVENOUS | Status: DC

## 2018-07-27 MED ORDER — SODIUM CHLORIDE 0.9% FLUSH: 3.0000 mL | INTRAVENOUS | Status: DC | PRN

## 2018-07-27 MED ORDER — FERROUS SULFATE 325 (65 FE) MG PO TABS
325.0000 mg | ORAL_TABLET | Freq: Two times a day (BID) | ORAL | Status: DC
  Administered 2018-07-28 – 2018-07-31 (×7): 325 mg via ORAL
  Filled 2018-07-27 (×7): qty 1

## 2018-07-27 MED ORDER — SODIUM CHLORIDE 0.9 % IV SOLN: 250.0000 mL | INTRAVENOUS | Status: DC | PRN

## 2018-07-27 NOTE — Progress Notes (Signed)
Post Partum Day 1 Subjective: Doing well, no complaints.  Tolerating regular diet, pain with PO meds. Has been on bedrest with Magnesium infusing and foley catheter patent.   No CP SOB Fever,Chills, N/V or leg pain; denies nipple or breast pain Denies HA change of vision, RUQ/epigastric pain  Objective: BP 133/76   Pulse 84   Temp 98.4 F (36.9 C) (Oral)   Resp 16   Ht 5\' 7"  (1.702 m)   Wt 104.3 kg   SpO2 100%   Breastfeeding Unknown   BMI 36.02 kg/m    Intake/Output Summary (Last 24 hours) at 07/27/2018 1929 Last data filed at 07/27/2018 1845 Gross per 24 hour  Intake 4565.14 ml  Output 3476 ml  Net 1089.14 ml   Foley cath: draining well since pitocin DC at 16hrs, 150-300/hr x 7 last hrs.   Physical Exam:  General: NAD Breasts: soft/nontender CV: RRR Pulm: nl effort, CTABL Abdomen: soft, NT, BS x 4 Perineum: minimal edema, laceration repair well approximated Lochia: small Uterine Fundus: fundus firm and 2 fb below umbilicus DVT Evaluation: no cords, ttp LEs   Recent Labs    07/25/18 1223 07/27/18 0554  HGB 9.7* 8.0*  HCT 31.2* 26.2*  WBC 11.0* 14.4*  PLT 151 112*    Assessment/Plan: 23 y.o. G1P0101 postpartum day # 1  - Continue routine PP care - Lactation consult prn.  -  Acute blood loss anemia - hemodynamically stable and asymptomatic; start po ferrous sulfate BID with stool softeners  - Immunization status: all Imms up to date, offer flu vaccine if available - DC Magnesium, Foley cath. Monitor BP and urine output closely, plan repeat labs in AM.    Disposition: transfer to routine PP care    Randa Ngo, CNM 07/27/2018  7:28 PM

## 2018-07-27 NOTE — Lactation Note (Addendum)
This note was copied from a baby's chart. Lactation Consultation Note  Patient Name: Loretta Bennett Today's Date: 07/27/2018   Assisted mom with positioning in football hold on left breast and biological cradle hold on right with pillow support for comfort.  Mom's breasts don't have much glandular tissue, wide spaced breasts and bulbar nipples/areolas.  Demonstrated how to easily hand express lots of colostrum though.  Loretta Bennett was on and off the left breast.  Once she was put skin to skin, she started rooting for the breast again.  Placed her in biological cradle hold on right breast.  She latched with minimal assistance and began strong rhythmic sucking with swallows.  Mom reports strong tugs without pain.  Loretta Bennett sustained the latch for 18 minutes before starting to slip off to the tip of the nipple encouraging mom to break the suction at that point.  Discussed supply and demand, normal course of lactation and routine newborn feeding patterns.  Encouraged mom to call lactation with any questions, concerns or assistance.  Maternal Data    Feeding Feeding Type: Breast Fed  LATCH Score Latch: Grasps breast easily, tongue down, lips flanged, rhythmical sucking.  Audible Swallowing: A few with stimulation  Type of Nipple: Everted at rest and after stimulation  Comfort (Breast/Nipple): Soft / non-tender  Hold (Positioning): No assistance needed to correctly position infant at breast.  LATCH Score: 9  Interventions    Lactation Tools Discussed/Used     Consult Status      Louis Meckel 07/27/2018, 6:10 PM

## 2018-07-27 NOTE — Anesthesia Postprocedure Evaluation (Signed)
Anesthesia Post Note  Patient: Loretta Bennett  Procedure(s) Performed: AN AD HOC LABOR EPIDURAL  Patient location during evaluation: L&D Anesthesia Type: Epidural Level of consciousness: awake and alert Pain management: pain level controlled Vital Signs Assessment: post-procedure vital signs reviewed and stable Respiratory status: spontaneous breathing, nonlabored ventilation and respiratory function stable Cardiovascular status: stable Postop Assessment: no headache, no backache, patient able to bend at knees and adequate PO intake (epidural resolved) Anesthetic complications: no Comments: Patient still on magnesium for pre-eclampsia, so foley catheter still in place and patient has not yet been allowed to ambulate; however, the patient has good lower extremity strength and reports no areas of numbness.     Last Vitals:  Vitals:   07/27/18 0928 07/27/18 0930  BP: 129/73   Pulse: 88   Resp: 12   Temp:    SpO2:  97%    Last Pain:  Vitals:   07/27/18 0630  TempSrc:   PainSc: 0-No pain                 Lenard Simmer

## 2018-07-27 NOTE — Progress Notes (Signed)
Pt now s/p delivery almost 12 hrs with varying amounts of urine output. Magnesium continues. Bladder scan of . Foley cath in place with edematous labia.  No SOB, O2 sats 97-100%. BP below severe range.  Vitals:   07/27/18 0630 07/27/18 0705  BP: (!) 141/80   Pulse: 79   Resp:    Temp:    SpO2: 96% 97%   A: Severe PreE, now delivered. Uncertain urine output P: - flush Foley, advance. Leave foley in place to address significant labial edema - continue bladder scan with every check - UA and P:C ratio reassuring, lower than on admission despite blood in urine.

## 2018-07-28 LAB — CBC WITH DIFFERENTIAL/PLATELET
Abs Immature Granulocytes: 0.13 10*3/uL — ABNORMAL HIGH (ref 0.00–0.07)
Basophils Absolute: 0 10*3/uL (ref 0.0–0.1)
Basophils Relative: 0 %
Eosinophils Absolute: 0.3 10*3/uL (ref 0.0–0.5)
Eosinophils Relative: 2 %
HCT: 25.1 % — ABNORMAL LOW (ref 36.0–46.0)
Hemoglobin: 7.5 g/dL — ABNORMAL LOW (ref 12.0–15.0)
Immature Granulocytes: 1 %
LYMPHS ABS: 3.6 10*3/uL (ref 0.7–4.0)
Lymphocytes Relative: 31 %
MCH: 23.7 pg — AB (ref 26.0–34.0)
MCHC: 29.9 g/dL — ABNORMAL LOW (ref 30.0–36.0)
MCV: 79.2 fL — ABNORMAL LOW (ref 80.0–100.0)
Monocytes Absolute: 0.4 10*3/uL (ref 0.1–1.0)
Monocytes Relative: 4 %
Neutro Abs: 7.2 10*3/uL (ref 1.7–7.7)
Neutrophils Relative %: 62 %
Platelets: 127 10*3/uL — ABNORMAL LOW (ref 150–400)
RBC: 3.17 MIL/uL — ABNORMAL LOW (ref 3.87–5.11)
RDW: 17.4 % — ABNORMAL HIGH (ref 11.5–15.5)
WBC: 11.6 10*3/uL — ABNORMAL HIGH (ref 4.0–10.5)
nRBC: 0 % (ref 0.0–0.2)

## 2018-07-28 LAB — COMPREHENSIVE METABOLIC PANEL
ALT: 28 U/L (ref 0–44)
AST: 27 U/L (ref 15–41)
Albumin: 2 g/dL — ABNORMAL LOW (ref 3.5–5.0)
Alkaline Phosphatase: 82 U/L (ref 38–126)
Anion gap: 5 (ref 5–15)
BUN: 12 mg/dL (ref 6–20)
CO2: 24 mmol/L (ref 22–32)
Calcium: 7.3 mg/dL — ABNORMAL LOW (ref 8.9–10.3)
Chloride: 110 mmol/L (ref 98–111)
Creatinine, Ser: 0.65 mg/dL (ref 0.44–1.00)
GFR calc non Af Amer: >60 mL/min (ref >60)
Glucose, Bld: 80 mg/dL (ref 70–99)
Potassium: 4 mmol/L (ref 3.5–5.1)
Sodium: 139 mmol/L (ref 135–145)
TOTAL PROTEIN: 4.4 g/dL — AB (ref 6.5–8.1)
Total Bilirubin: 0.4 mg/dL (ref 0.3–1.2)

## 2018-07-28 NOTE — Progress Notes (Signed)
MOB and FOB watched infant CPR video. All questions answered. Kadra Kohan S Fenton, RN  

## 2018-07-29 LAB — PROTEIN / CREATININE RATIO, URINE
Creatinine, Urine: 104 mg/dL
Protein Creatinine Ratio: 0.19 mg/mg{Cre} — ABNORMAL HIGH (ref 0.00–0.15)
Total Protein, Urine: 20 mg/dL

## 2018-07-29 LAB — SURGICAL PATHOLOGY

## 2018-07-29 MED ORDER — LABETALOL HCL 200 MG PO TABS
200.0000 mg | ORAL_TABLET | Freq: Two times a day (BID) | ORAL | Status: DC
  Administered 2018-07-29 (×2): 200 mg via ORAL
  Filled 2018-07-29 (×2): qty 1

## 2018-07-29 NOTE — Progress Notes (Signed)
Post Partum Day 2 Subjective: Doing well, no complaints.  Tolerating regular diet, pain with PO meds. Still ankle swelling  No CP SOB Fever,Chills, N/V or leg pain; denies nipple or breast pain Denies HA change of vision, RUQ/epigastric pain  Objective: BP (!) 142/94 (BP Location: Left Arm) Comment: notify Lodema Pilot, RN  Pulse 77   Temp 98.5 F (36.9 C) (Oral)   Resp 20   Ht 5\' 7"  (1.702 m)   Wt 104.3 kg   SpO2 99%   Breastfeeding Unknown   BMI 36.02 kg/m    Intake/Output Summary (Last 24 hours) at 07/29/2018 1220 Last data filed at 07/28/2018 1902 Gross per 24 hour  Intake 360 ml  Output 400 ml  Net -40 ml   Foley cath: draining well since pitocin DC at 16hrs, 150-300/hr x 7 last hrs.   Physical Exam:  General: NAD Breasts: soft/nontender CV: RRR Pulm: nl effort, CTABL Abdomen: soft, NT, BS x 4 Lochia: small Uterine Fundus: fundus firm and 2 fb below umbilicus DVT Evaluation: no cords, ttp LEs   Recent Labs    07/27/18 0554 07/28/18 0520  HGB 8.0* 7.5*  HCT 26.2* 25.1*  WBC 14.4* 11.6*  PLT 112* 127*    Assessment/Plan: 23 y.o. G1P0101 postpartum day # 2  - Continue routine PP care - Lactation consult prn.  -  Acute blood loss anemia - hemodynamically stable and asymptomatic; start po ferrous sulfate BID with stool softeners. Repeat CBC in am for hgb 7.5 - Immunization status: all Imms up to date, offer flu vaccine if available - Elevated blood pressure, s/p severe PreE: start labetolol 200mg  BID. Repeat P:C ratio. - Baby with jaundice    Loretta Douglas, MD 07/29/2018  12:20 PM

## 2018-07-29 NOTE — Lactation Note (Addendum)
This note was copied from a baby's chart. 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   She pumped 6 cc colostrum at this pumping session  Feeding Feeding Type: Bottle Fed - Formula  LATCH Score                   Interventions    Lactation Tools Discussed/Used     Consult Status      Dyann Kief 07/29/2018, 11:54 AM

## 2018-07-30 LAB — CBC
HCT: 20.2 % — ABNORMAL LOW (ref 36.0–46.0)
HCT: 24.9 % — ABNORMAL LOW (ref 36.0–46.0)
Hemoglobin: 5.9 g/dL — ABNORMAL LOW (ref 12.0–15.0)
Hemoglobin: 7.8 g/dL — ABNORMAL LOW (ref 12.0–15.0)
MCH: 23.7 pg — ABNORMAL LOW (ref 26.0–34.0)
MCH: 25.3 pg — ABNORMAL LOW (ref 26.0–34.0)
MCHC: 29.2 g/dL — ABNORMAL LOW (ref 30.0–36.0)
MCHC: 31.3 g/dL (ref 30.0–36.0)
MCV: 81.1 fL (ref 80.0–100.0)
MCV: 80.8 fL (ref 80.0–100.0)
NRBC: 0.6 % — AB (ref 0.0–0.2)
PLATELETS: 128 10*3/uL — AB (ref 150–400)
Platelets: 113 10*3/uL — ABNORMAL LOW (ref 150–400)
RBC: 2.49 MIL/uL — ABNORMAL LOW (ref 3.87–5.11)
RBC: 3.08 MIL/uL — ABNORMAL LOW (ref 3.87–5.11)
RDW: 18.3 % — ABNORMAL HIGH (ref 11.5–15.5)
RDW: 17.8 % — ABNORMAL HIGH (ref 11.5–15.5)
WBC: 5.4 10*3/uL (ref 4.0–10.5)
WBC: 8.3 10*3/uL (ref 4.0–10.5)
nRBC: 0.5 % — ABNORMAL HIGH (ref 0.0–0.2)

## 2018-07-30 LAB — PREPARE RBC (CROSSMATCH)

## 2018-07-30 LAB — HEMOGLOBIN AND HEMATOCRIT, BLOOD
HCT: 21.6 % — ABNORMAL LOW (ref 36.0–46.0)
Hemoglobin: 6.4 g/dL — ABNORMAL LOW (ref 12.0–15.0)

## 2018-07-30 MED ORDER — LABETALOL HCL 200 MG PO TABS
400.0000 mg | ORAL_TABLET | Freq: Two times a day (BID) | ORAL | Status: DC
  Administered 2018-07-30: 400 mg via ORAL
  Filled 2018-07-30: qty 2

## 2018-07-30 MED ORDER — IBUPROFEN 600 MG PO TABS
600.0000 mg | ORAL_TABLET | Freq: Four times a day (QID) | ORAL | Status: DC
  Administered 2018-07-30 – 2018-07-31 (×6): 600 mg via ORAL
  Filled 2018-07-30 (×6): qty 1

## 2018-07-30 MED ORDER — LABETALOL HCL 5 MG/ML IV SOLN
20.0000 mg | INTRAVENOUS | Status: DC | PRN
  Administered 2018-07-30: 20 mg via INTRAVENOUS
  Filled 2018-07-30 (×3): qty 4

## 2018-07-30 MED ORDER — NIFEDIPINE ER OSMOTIC RELEASE 30 MG PO TB24
30.0000 mg | ORAL_TABLET | Freq: Every day | ORAL | Status: DC
  Administered 2018-07-30 – 2018-07-31 (×2): 30 mg via ORAL
  Filled 2018-07-30 (×2): qty 1

## 2018-07-30 MED ORDER — VITAMIN C 500 MG PO TABS
500.0000 mg | ORAL_TABLET | Freq: Two times a day (BID) | ORAL | Status: DC
  Administered 2018-07-30 – 2018-07-31 (×3): 500 mg via ORAL
  Filled 2018-07-30 (×3): qty 1

## 2018-07-30 MED ORDER — LABETALOL HCL 5 MG/ML IV SOLN
40.0000 mg | INTRAVENOUS | Status: DC | PRN
  Filled 2018-07-30: qty 8

## 2018-07-30 MED ORDER — INFLUENZA VAC SPLIT QUAD 0.5 ML IM SUSY
0.5000 mL | PREFILLED_SYRINGE | INTRAMUSCULAR | Status: AC | PRN
  Administered 2018-07-31: 0.5 mL via INTRAMUSCULAR
  Filled 2018-07-30: qty 0.5

## 2018-07-30 MED ORDER — LABETALOL HCL 5 MG/ML IV SOLN
80.0000 mg | INTRAVENOUS | Status: DC | PRN
  Filled 2018-07-30: qty 16

## 2018-07-30 MED ORDER — LABETALOL HCL 200 MG PO TABS
400.0000 mg | ORAL_TABLET | Freq: Two times a day (BID) | ORAL | Status: DC
  Administered 2018-07-30 – 2018-07-31 (×2): 400 mg via ORAL
  Filled 2018-07-30 (×2): qty 2

## 2018-07-30 MED ORDER — LABETALOL HCL 200 MG PO TABS: 600.0000 mg | ORAL_TABLET | Freq: Two times a day (BID) | ORAL | Status: DC

## 2018-07-30 MED ORDER — HYDRALAZINE HCL 20 MG/ML IJ SOLN: 10.0000 mg | INTRAMUSCULAR | Status: DC | PRN

## 2018-07-30 MED ORDER — SODIUM CHLORIDE 0.9% IV SOLUTION
Freq: Once | INTRAVENOUS | Status: AC
  Administered 2018-07-30: 12:00:00 via INTRAVENOUS

## 2018-07-30 NOTE — Plan of Care (Signed)
Vs stable; up ad lib; tolerating regular diet; taking motrin for pain control 

## 2018-07-30 NOTE — Progress Notes (Signed)
Post Partum Day 4  Subjective: Doing well, no concerns. Ambulating without difficulty, pain managed with PO meds, tolerating regular diet, and voiding without difficulty.   No fever/chills, chest pain, shortness of breath, nausea/vomiting, or leg pain. No nipple or breast pain. No headache, visual changes, or RUQ/epigastric pain.  Eager to go home as soon as possible.   Objective: BP (!) 144/107 (BP Location: Left Arm) Comment: notify Kendra RN  Pulse 68   Temp 98.5 F (36.9 C) (Oral)   Resp 20   Ht _0  (1.702 m)   Wt 104.3 kg   SpO2 100%   Breastfeeding Unknown   BMI 36.02 kg/m    Physical Exam:  General: alert, cooperative, appears stated age, no distress and pale Breasts: soft/nontender CV: RRR Pulm: nl effort, CTABL Abdomen: soft, non-tender, active bowel sounds Uterine Fundus: firm Lochia: appropriate DVT Evaluation: No evidence of DVT seen on physical exam. No cords or calf tenderness. No significant calf/ankle edema.  Recent Labs    07/28/18 0520 07/30/18 0414 07/30/18 0824  HGB 7.5* 5.9* 6.4*  HCT 25.1* 20.2* 21.6*  WBC 11.6* 5.4  --   PLT 127* 113*  --     Assessment/Plan: 23 y.o. G1P0101 postpartum day # 4  -Continue routine postpartum care -Lactation consult PRN for breastfeeding  -Acute blood loss anemia: repeat hemoglobin this morning 6.4 after initial level of 5.9, continue PO ferrous sulfate BID with vitamin C and stool softeners, 1 unit PRBCs ordered for transfusion today -Blood pressure continues to be elevated. Labetalol increased to 43m BID from 2025mBID.  -Immunization status: Needs MMR and flu prior to discharge -Patient eager for discharge home.   Disposition: Continue inpatient postpartum care   LOS: 5 days   MaLisette GrinderCNM 07/30/2018, 8:47 AM   ----- MaLisette Grinderertified Nurse Midwife [facility]

## 2018-07-30 NOTE — Progress Notes (Signed)
Discussed patient's history and current blood pressure trend with Dr. Leeroy Bock Ward. She advises addition of Procardia 30mg  PO daily in addition to current regimen of labetalol 400mg  PO BID. If patient is unresponsive to IV labetalol for severe range pressure, consider immediate release nifedipine.   Genia Del, [place] 07/30/2018 3:49 PM

## 2018-07-30 NOTE — Clinical Social Work Maternal (Signed)
  CLINICAL SOCIAL WORK MATERNAL/CHILD NOTE  Patient Details  Name: Loretta Bennett MRN: [id number] Date of Birth: 18-Apr-1996  Date:  07/30/2018  Clinical Social Worker Initiating Note:  Shela Leff MSW,LCSW Date/Time: Initiated:  07/30/18/      Child's Name:      Biological Parents:  Mother, Father   Need for Interpreter:  None   Reason for Referral:  Behavioral Health Concerns   Address:  [place] [address]    Phone number:  [phone] (home)     Additional phone number: none  Household Members/Support Persons (HM/SP):       HM/SP Name Relationship DOB or Age  HM/SP -1        HM/SP -2        HM/SP -3        HM/SP -4        HM/SP -5        HM/SP -6        HM/SP -7        HM/SP -8          Natural Supports (not living in the home):  Extended Family, Friends   Chiropodist:     Employment:     Type of Work:     Education:      Homebound arranged:    Museum/gallery curator Resources:  Medicaid   Other Resources:      Cultural/Religious Considerations Which May Impact Care:  none  Strengths:  Ability to meet basic needs , Compliance with medical plan , Home prepared for child    Psychotropic Medications:         Pediatrician:       Pediatrician List:   [place]      Pediatrician Fax Number:    Risk Factors/Current Problems:  Mental Health Concerns    Cognitive State:  Alert , Able to Concentrate    Mood/Affect:  Happy , Calm , Bright    CSW Assessment: CSW met with patient this morning at bedside. She was actively holding her infant. CSW explained role and purpose of visit. Patient states that in the home will be her mother and father of baby. She states that they have been supportive. She has all necessities for her newborn and this is her first child. CSW and patient discussed the history of her depression which and that she had  thoughts of self harm back in the beginning of her high school years. She states she never acted on those thoughts and that her situation has changed. She states that she has not ever seen a mental health provider or taken any medication for her history of depression. She further states she is not "sold" on therapy or meidications. She states that she knows to reach out to her physician if she begins to have signs and symptoms. She has been educated on postpartum depression as well. No further interventions at this time.   CSW Plan/Description:  No Further Intervention Required/No Barriers to Discharge, Other Patient/Family Education    Shela Leff, [place] 07/30/2018, 9:37 AM

## 2018-07-31 DIAGNOSIS — O1410 Severe pre-eclampsia, unspecified trimester: Secondary | ICD-10-CM | POA: Diagnosis present

## 2018-07-31 DIAGNOSIS — D649 Anemia, unspecified: Secondary | ICD-10-CM | POA: Diagnosis not present

## 2018-07-31 LAB — TYPE AND SCREEN
ABO/RH(D): A POS
Antibody Screen: NEGATIVE
Unit division: 0

## 2018-07-31 LAB — CBC
HCT: 26.1 % — ABNORMAL LOW (ref 36.0–46.0)
Hemoglobin: 7.9 g/dL — ABNORMAL LOW (ref 12.0–15.0)
MCH: 24.7 pg — AB (ref 26.0–34.0)
MCHC: 30.3 g/dL (ref 30.0–36.0)
MCV: 81.6 fL (ref 80.0–100.0)
Platelets: 182 10*3/uL (ref 150–400)
RBC: 3.2 MIL/uL — ABNORMAL LOW (ref 3.87–5.11)
RDW: 18.6 % — ABNORMAL HIGH (ref 11.5–15.5)
WBC: 8.2 10*3/uL (ref 4.0–10.5)
nRBC: 0.2 % (ref 0.0–0.2)

## 2018-07-31 LAB — BPAM RBC
BLOOD PRODUCT EXPIRATION DATE: 202004042359
ISSUE DATE / TIME: 202003181142
Unit Type and Rh: 6200

## 2018-07-31 MED ORDER — LABETALOL HCL 200 MG PO TABS: 400.0000 mg | ORAL_TABLET | Freq: Three times a day (TID) | ORAL | 2 refills | Status: AC

## 2018-07-31 MED ORDER — NIFEDIPINE ER 30 MG PO TB24: 30.0000 mg | ORAL_TABLET | Freq: Every day | ORAL | 2 refills | Status: AC

## 2018-07-31 MED ORDER — IBUPROFEN 600 MG PO TABS: 600.0000 mg | ORAL_TABLET | Freq: Four times a day (QID) | ORAL | 0 refills | Status: AC

## 2018-07-31 MED ORDER — NORETHINDRONE 0.35 MG PO TABS: 1.0000 | ORAL_TABLET | Freq: Every day | ORAL | 11 refills | Status: AC

## 2018-07-31 NOTE — Progress Notes (Signed)
Newborn discharged home.  Discharge instructions and appointment given to and reviewed with parent.  Parent verbalized understanding. All testing completed. Tag removed, bands matched. Escorted by staff, carseat present. 

## 2018-07-31 NOTE — Progress Notes (Signed)
   07/31/18 0900  Clinical Encounter Type  Visited With Family  Visit Type Initial  Referral From Chaplain  Consult/Referral To Chaplain  Chaplain was rounding and visit patient, this was her first birth. Chaplain gave patient a packet of a prayer cloth and hat for baby. Patient was very appreciative of Chaplain visit.
